# Patient Record
Sex: Male | Born: 1960 | Race: White | Hispanic: No | Marital: Single | Smoking: Never smoker
Health system: Southern US, Community
[De-identification: ages and names within clinical notes are randomized; demographics above are authoritative.]

## PROBLEM LIST (undated history)

## (undated) DIAGNOSIS — K219 Gastro-esophageal reflux disease without esophagitis: Secondary | ICD-10-CM

## (undated) DIAGNOSIS — H9190 Unspecified hearing loss, unspecified ear: Secondary | ICD-10-CM

## (undated) DIAGNOSIS — M1990 Unspecified osteoarthritis, unspecified site: Secondary | ICD-10-CM

## (undated) HISTORY — PX: HAND SURGERY: SHX662

## (undated) HISTORY — DX: Unspecified osteoarthritis, unspecified site: M19.90

---

## 1999-09-20 ENCOUNTER — Emergency Department (HOSPITAL_COMMUNITY): Admission: EM | Admit: 1999-09-20 | Discharge: 1999-09-20 | Payer: Self-pay | Admitting: Emergency Medicine

## 1999-09-21 ENCOUNTER — Emergency Department (HOSPITAL_COMMUNITY): Admission: EM | Admit: 1999-09-21 | Discharge: 1999-09-21 | Payer: Self-pay | Admitting: Emergency Medicine

## 2001-03-26 ENCOUNTER — Emergency Department (HOSPITAL_COMMUNITY): Admission: EM | Admit: 2001-03-26 | Discharge: 2001-03-26 | Payer: Self-pay | Admitting: *Deleted

## 2001-03-26 ENCOUNTER — Encounter: Payer: Self-pay | Admitting: Emergency Medicine

## 2001-03-27 ENCOUNTER — Ambulatory Visit (HOSPITAL_COMMUNITY): Admission: RE | Admit: 2001-03-27 | Discharge: 2001-03-27 | Payer: Self-pay | Admitting: Emergency Medicine

## 2001-03-27 ENCOUNTER — Encounter: Payer: Self-pay | Admitting: Emergency Medicine

## 2001-03-29 ENCOUNTER — Emergency Department (HOSPITAL_COMMUNITY): Admission: EM | Admit: 2001-03-29 | Discharge: 2001-03-29 | Payer: Self-pay | Admitting: Emergency Medicine

## 2014-04-14 ENCOUNTER — Encounter (INDEPENDENT_AMBULATORY_CARE_PROVIDER_SITE_OTHER): Payer: Self-pay | Admitting: Surgery

## 2014-04-29 ENCOUNTER — Encounter (INDEPENDENT_AMBULATORY_CARE_PROVIDER_SITE_OTHER): Payer: Self-pay | Admitting: Surgery

## 2014-04-29 ENCOUNTER — Ambulatory Visit (INDEPENDENT_AMBULATORY_CARE_PROVIDER_SITE_OTHER): Payer: BC Managed Care – PPO | Admitting: Surgery

## 2014-04-29 VITALS — BP 124/78 | HR 63 | Temp 98.0°F | Ht 71.0 in | Wt 229.0 lb

## 2014-04-29 DIAGNOSIS — K429 Umbilical hernia without obstruction or gangrene: Secondary | ICD-10-CM | POA: Insufficient documentation

## 2014-04-29 NOTE — Progress Notes (Signed)
Patient ID: Robert Wyatt, male   DOB: 01/21/1961, 53 y.o.   MRN: [id number]  Chief Complaint  Patient presents with  . Umbilical Hernia    HPI Robert DecemberWilliam T Sison is a 53 y.o. male.   HPI This is a pleasant gentleman referred by Dr. Phillips OdorGolding for evaluation of an umbilical hernia. The patient noticed it several weeks ago after golfing. He has mild discomfort right at the umbilicus. He has no nausea or vomiting or obstructive symptoms. He is otherwise healthy. Past Medical History  Diagnosis Date  . Arthritis     Past Surgical History  Procedure Laterality Date  . Hand surgery      History reviewed. No pertinent family history.  Social History History  Substance Use Topics  . Smoking status: Never Smoker   . Smokeless tobacco: Not on file  . Alcohol Use: Yes    No Known Allergies  Current Outpatient Prescriptions  Medication Sig Dispense Refill  . ibuprofen (ADVIL,MOTRIN) 100 MG chewable tablet Chew by mouth every 8 (eight) hours as needed.      . meloxicam (MOBIC) 15 MG tablet Take 15 mg by mouth daily.       No current facility-administered medications for this visit.    Review of Systems Review of Systems  Constitutional: Negative for fever, chills and unexpected weight change.  HENT: Negative for congestion, hearing loss, sore throat, trouble swallowing and voice change.   Eyes: Negative for visual disturbance.  Respiratory: Negative for cough and wheezing.   Cardiovascular: Negative for chest pain, palpitations and leg swelling.  Gastrointestinal: Positive for abdominal pain. Negative for nausea, vomiting, diarrhea, constipation, blood in stool, abdominal distention, anal bleeding and rectal pain.  Genitourinary: Negative for hematuria and difficulty urinating.  Musculoskeletal: Negative for arthralgias.  Skin: Negative for rash and wound.  Neurological: Negative for seizures, syncope, weakness and headaches.  Hematological: Negative for adenopathy. Does not  bruise/bleed easily.  Psychiatric/Behavioral: Negative for confusion.    Blood pressure 124/78, pulse 63, temperature 98 F (36.7 C), height 5\' 11"  (1.803 m), weight 229 lb (103.874 kg).  Physical Exam Physical Exam  Constitutional: He is oriented to person, place, and time. He appears well-developed and well-nourished. No distress.  HENT:  Head: Normocephalic and atraumatic.  Right Ear: External ear normal.  Left Ear: External ear normal.  Nose: Nose normal.  Mouth/Throat: Oropharynx is clear and moist.  Eyes: Conjunctivae are normal. Right eye exhibits no discharge. Left eye exhibits no discharge. No scleral icterus.  Neck: Normal range of motion. Neck supple. No tracheal deviation present.  Cardiovascular: Normal rate, regular rhythm, normal heart sounds and intact distal pulses.   No murmur heard. Pulmonary/Chest: Effort normal and breath sounds normal. No respiratory distress. He has no wheezes. He has no rales.  Abdominal: Soft. Bowel sounds are normal. He exhibits no distension. There is no tenderness. There is no rebound.  Small, slightly tender umbilical hernia located just above the umbilicus  Musculoskeletal: Normal range of motion. He exhibits no edema and no tenderness.  Lymphadenopathy:    He has no cervical adenopathy.  Neurological: He is alert and oriented to person, place, and time.  Skin: Skin is warm and dry. No rash noted. He is not diaphoretic. No erythema.  Psychiatric: His behavior is normal. Judgment normal.    Data Reviewed   Assessment    Umbilical hernia     Plan    I discussed the diagnoses with the patient in detail. Repair with mesh was recommended.  I discussed the procedure with him in detail. I discussed the risks of recurrence. He understands and wished to proceed with surgery.        Jonice Cerra A 04/29/2014, 2:37 PM

## 2014-06-03 ENCOUNTER — Encounter (HOSPITAL_BASED_OUTPATIENT_CLINIC_OR_DEPARTMENT_OTHER): Payer: Self-pay | Admitting: *Deleted

## 2014-06-03 NOTE — Progress Notes (Signed)
No labs needed

## 2014-06-08 NOTE — H&P (Signed)
Chief Complaint   Patient presents with   .  Umbilical Hernia   HPI  Robert Wyatt is a 53 y.o. male.  HPI  This is a pleasant gentleman referred by Dr. Phillips Odor for evaluation of an umbilical hernia. The patient noticed it several weeks ago after golfing. He has mild discomfort right at the umbilicus. He has no nausea or vomiting or obstructive symptoms. He is otherwise healthy.  Past Medical History   Diagnosis  Date   .  Arthritis     Past Surgical History   Procedure  Laterality  Date   .  Hand surgery     History reviewed. No pertinent family history.  Social History  History   Substance Use Topics   .  Smoking status:  Never Smoker   .  Smokeless tobacco:  Not on file   .  Alcohol Use:  Yes   No Known Allergies  Current Outpatient Prescriptions   Medication  Sig  Dispense  Refill   .  ibuprofen (ADVIL,MOTRIN) 100 MG chewable tablet  Chew by mouth every 8 (eight) hours as needed.     .  meloxicam (MOBIC) 15 MG tablet  Take 15 mg by mouth daily.      No current facility-administered medications for this visit.   Review of Systems  Review of Systems  Constitutional: Negative for fever, chills and unexpected weight change.  HENT: Negative for congestion, hearing loss, sore throat, trouble swallowing and voice change.  Eyes: Negative for visual disturbance.  Respiratory: Negative for cough and wheezing.  Cardiovascular: Negative for chest pain, palpitations and leg swelling.  Gastrointestinal: Positive for abdominal pain. Negative for nausea, vomiting, diarrhea, constipation, blood in stool, abdominal distention, anal bleeding and rectal pain.  Genitourinary: Negative for hematuria and difficulty urinating.  Musculoskeletal: Negative for arthralgias.  Skin: Negative for rash and wound.  Neurological: Negative for seizures, syncope, weakness and headaches.  Hematological: Negative for adenopathy. Does not bruise/bleed easily.  Psychiatric/Behavioral: Negative for  confusion.  Blood pressure 124/78, pulse 63, temperature 98 F (36.7 C), height 5\' 11"  (1.803 m), weight 229 lb (103.874 kg).  Physical Exam  Physical Exam  Constitutional: He is oriented to person, place, and time. He appears well-developed and well-nourished. No distress.  HENT:  Head: Normocephalic and atraumatic.  Right Ear: External ear normal.  Left Ear: External ear normal.  Nose: Nose normal.  Mouth/Throat: Oropharynx is clear and moist.  Eyes: Conjunctivae are normal. Right eye exhibits no discharge. Left eye exhibits no discharge. No scleral icterus.  Neck: Normal range of motion. Neck supple. No tracheal deviation present.  Cardiovascular: Normal rate, regular rhythm, normal heart sounds and intact distal pulses.  No murmur heard.  Pulmonary/Chest: Effort normal and breath sounds normal. No respiratory distress. He has no wheezes. He has no rales.  Abdominal: Soft. Bowel sounds are normal. He exhibits no distension. There is no tenderness. There is no rebound.  Small, slightly tender umbilical hernia located just above the umbilicus  Musculoskeletal: Normal range of motion. He exhibits no edema and no tenderness.  Lymphadenopathy:  He has no cervical adenopathy.  Neurological: He is alert and oriented to person, place, and time.  Skin: Skin is warm and dry. No rash noted. He is not diaphoretic. No erythema.  Psychiatric: His behavior is normal. Judgment normal.  Data Reviewed  Assessment  Umbilical hernia  Plan  I discussed the diagnoses with the patient in detail. Repair with mesh was recommended. I discussed the procedure  with him in detail. I discussed the risks of recurrence. He understands and wished to proceed with surgery.

## 2014-06-09 ENCOUNTER — Ambulatory Visit (HOSPITAL_BASED_OUTPATIENT_CLINIC_OR_DEPARTMENT_OTHER)
Admission: RE | Admit: 2014-06-09 | Discharge: 2014-06-09 | Disposition: A | Discharge status: Home / Self Care | Payer: BC Managed Care – PPO | Source: Ambulatory Visit | Attending: Surgery | Admitting: Surgery

## 2014-06-09 ENCOUNTER — Encounter (HOSPITAL_BASED_OUTPATIENT_CLINIC_OR_DEPARTMENT_OTHER): Payer: BC Managed Care – PPO | Admitting: Certified Registered"

## 2014-06-09 ENCOUNTER — Encounter (HOSPITAL_BASED_OUTPATIENT_CLINIC_OR_DEPARTMENT_OTHER)
Admission: RE | Disposition: A | Discharge status: Home / Self Care | Payer: Self-pay | Source: Ambulatory Visit | Attending: Surgery

## 2014-06-09 ENCOUNTER — Encounter (HOSPITAL_BASED_OUTPATIENT_CLINIC_OR_DEPARTMENT_OTHER): Payer: Self-pay | Admitting: *Deleted

## 2014-06-09 ENCOUNTER — Ambulatory Visit (HOSPITAL_BASED_OUTPATIENT_CLINIC_OR_DEPARTMENT_OTHER): Payer: BC Managed Care – PPO | Admitting: Certified Registered"

## 2014-06-09 DIAGNOSIS — Z79899 Other long term (current) drug therapy: Secondary | ICD-10-CM | POA: Diagnosis not present

## 2014-06-09 DIAGNOSIS — K429 Umbilical hernia without obstruction or gangrene: Secondary | ICD-10-CM

## 2014-06-09 DIAGNOSIS — M129 Arthropathy, unspecified: Secondary | ICD-10-CM | POA: Insufficient documentation

## 2014-06-09 HISTORY — PX: UMBILICAL HERNIA REPAIR: SHX196

## 2014-06-09 HISTORY — PX: INSERTION OF MESH: SHX5868

## 2014-06-09 HISTORY — DX: Gastro-esophageal reflux disease without esophagitis: K21.9

## 2014-06-09 HISTORY — DX: Unspecified hearing loss, unspecified ear: H91.90

## 2014-06-09 SURGERY — REPAIR, HERNIA, UMBILICAL, ADULT
Anesthesia: General | Site: Abdomen

## 2014-06-09 MED ORDER — MIDAZOLAM HCL 2 MG/2ML IJ SOLN
INTRAMUSCULAR | Status: AC
  Filled 2014-06-09: qty 2

## 2014-06-09 MED ORDER — FENTANYL CITRATE 0.05 MG/ML IJ SOLN
INTRAMUSCULAR | Status: AC
  Filled 2014-06-09: qty 6

## 2014-06-09 MED ORDER — FENTANYL CITRATE 0.05 MG/ML IJ SOLN: 50.0000 ug | INTRAMUSCULAR | Status: DC | PRN

## 2014-06-09 MED ORDER — PROPOFOL 10 MG/ML IV BOLUS
INTRAVENOUS | Status: DC | PRN
  Administered 2014-06-09: 200 mg via INTRAVENOUS

## 2014-06-09 MED ORDER — KETOROLAC TROMETHAMINE 30 MG/ML IJ SOLN
INTRAMUSCULAR | Status: DC | PRN
  Administered 2014-06-09: 30 mg via INTRAVENOUS

## 2014-06-09 MED ORDER — ACETAMINOPHEN 650 MG RE SUPP: 650.0000 mg | RECTAL | Status: DC | PRN

## 2014-06-09 MED ORDER — OXYCODONE-ACETAMINOPHEN 5-325 MG PO TABS: 1.0000 | ORAL_TABLET | ORAL | Status: DC | PRN

## 2014-06-09 MED ORDER — BUPIVACAINE HCL 0.5 % IJ SOLN
INTRAMUSCULAR | Status: DC | PRN
  Administered 2014-06-09: 20 mL

## 2014-06-09 MED ORDER — BUPIVACAINE HCL (PF) 0.5 % IJ SOLN
INTRAMUSCULAR | Status: AC
  Filled 2014-06-09: qty 30

## 2014-06-09 MED ORDER — ACETAMINOPHEN 325 MG PO TABS: 650.0000 mg | ORAL_TABLET | ORAL | Status: DC | PRN

## 2014-06-09 MED ORDER — SODIUM CHLORIDE 0.9 % IJ SOLN: 3.0000 mL | INTRAMUSCULAR | Status: DC | PRN

## 2014-06-09 MED ORDER — SODIUM CHLORIDE 0.9 % IV SOLN: 250.0000 mL | INTRAVENOUS | Status: DC | PRN

## 2014-06-09 MED ORDER — LACTATED RINGERS IV SOLN
INTRAVENOUS | Status: DC
  Administered 2014-06-09 (×2): via INTRAVENOUS

## 2014-06-09 MED ORDER — FENTANYL CITRATE 0.05 MG/ML IJ SOLN
INTRAMUSCULAR | Status: DC | PRN
  Administered 2014-06-09: 100 ug via INTRAVENOUS

## 2014-06-09 MED ORDER — MIDAZOLAM HCL 5 MG/5ML IJ SOLN
INTRAMUSCULAR | Status: DC | PRN
Start: 2014-06-09 — End: 2014-06-09
  Administered 2014-06-09: 2 mg via INTRAVENOUS

## 2014-06-09 MED ORDER — CEFAZOLIN SODIUM-DEXTROSE 2-3 GM-% IV SOLR
INTRAVENOUS | Status: AC
  Filled 2014-06-09: qty 50

## 2014-06-09 MED ORDER — LIDOCAINE HCL (CARDIAC) 20 MG/ML IV SOLN
INTRAVENOUS | Status: DC | PRN
  Administered 2014-06-09: 60 mg via INTRAVENOUS

## 2014-06-09 MED ORDER — KETOROLAC TROMETHAMINE 30 MG/ML IJ SOLN: 15.0000 mg | Freq: Once | INTRAMUSCULAR | Status: DC | PRN

## 2014-06-09 MED ORDER — MORPHINE SULFATE 2 MG/ML IJ SOLN: 1.0000 mg | INTRAMUSCULAR | Status: DC | PRN

## 2014-06-09 MED ORDER — MIDAZOLAM HCL 2 MG/2ML IJ SOLN: 1.0000 mg | INTRAMUSCULAR | Status: DC | PRN

## 2014-06-09 MED ORDER — OXYCODONE HCL 5 MG PO TABS
5.0000 mg | ORAL_TABLET | ORAL | Status: DC | PRN
  Administered 2014-06-09: 5 mg via ORAL

## 2014-06-09 MED ORDER — CEFAZOLIN SODIUM-DEXTROSE 2-3 GM-% IV SOLR
2.0000 g | INTRAVENOUS | Status: AC
  Administered 2014-06-09: 2 g via INTRAVENOUS

## 2014-06-09 MED ORDER — ONDANSETRON HCL 4 MG/2ML IJ SOLN: 4.0000 mg | Freq: Once | INTRAMUSCULAR | Status: DC | PRN

## 2014-06-09 MED ORDER — SODIUM CHLORIDE 0.9 % IJ SOLN: 3.0000 mL | Freq: Two times a day (BID) | INTRAMUSCULAR | Status: DC

## 2014-06-09 MED ORDER — OXYCODONE HCL 5 MG PO TABS
ORAL_TABLET | ORAL | Status: AC
  Filled 2014-06-09: qty 1

## 2014-06-09 MED ORDER — DEXAMETHASONE SODIUM PHOSPHATE 4 MG/ML IJ SOLN
INTRAMUSCULAR | Status: DC | PRN
  Administered 2014-06-09: 10 mg via INTRAVENOUS

## 2014-06-09 MED ORDER — FENTANYL CITRATE 0.05 MG/ML IJ SOLN
INTRAMUSCULAR | Status: AC
  Filled 2014-06-09: qty 2

## 2014-06-09 MED ORDER — FENTANYL CITRATE 0.05 MG/ML IJ SOLN
25.0000 ug | INTRAMUSCULAR | Status: DC | PRN
  Administered 2014-06-09: 50 ug via INTRAVENOUS

## 2014-06-09 MED ORDER — ONDANSETRON HCL 4 MG/2ML IJ SOLN
INTRAMUSCULAR | Status: DC | PRN
  Administered 2014-06-09: 4 mg via INTRAVENOUS

## 2014-06-09 SURGICAL SUPPLY — 52 items
BENZOIN TINCTURE PRP APPL 2/3 (GAUZE/BANDAGES/DRESSINGS) IMPLANT
BLADE CLIPPER SURG (BLADE) ×3 IMPLANT
BLADE HEX COATED 2.75 (ELECTRODE) ×3 IMPLANT
BLADE SURG 15 STRL LF DISP TIS (BLADE) ×1 IMPLANT
BLADE SURG 15 STRL SS (BLADE) ×2
CANISTER SUCT 1200ML W/VALVE (MISCELLANEOUS) IMPLANT
CHLORAPREP W/TINT 26ML (MISCELLANEOUS) ×3 IMPLANT
CLOSURE WOUND 1/2 X4 (GAUZE/BANDAGES/DRESSINGS)
COVER MAYO STAND STRL (DRAPES) ×3 IMPLANT
COVER TABLE BACK 60X90 (DRAPES) ×3 IMPLANT
DECANTER SPIKE VIAL GLASS SM (MISCELLANEOUS) IMPLANT
DERMABOND ADVANCED (GAUZE/BANDAGES/DRESSINGS) ×2
DERMABOND ADVANCED .7 DNX12 (GAUZE/BANDAGES/DRESSINGS) ×1 IMPLANT
DRAPE PED LAPAROTOMY (DRAPES) ×3 IMPLANT
DRAPE UTILITY XL STRL (DRAPES) ×3 IMPLANT
DRSG TEGADERM 2-3/8X2-3/4 SM (GAUZE/BANDAGES/DRESSINGS) IMPLANT
DRSG TEGADERM 4X4.75 (GAUZE/BANDAGES/DRESSINGS) IMPLANT
ELECT REM PT RETURN 9FT ADLT (ELECTROSURGICAL) ×3
ELECTRODE REM PT RTRN 9FT ADLT (ELECTROSURGICAL) ×1 IMPLANT
GLOVE BIO SURGEON STRL SZ7.5 (GLOVE) ×3 IMPLANT
GLOVE BIOGEL PI IND STRL 7.0 (GLOVE) ×1 IMPLANT
GLOVE BIOGEL PI IND STRL 8 (GLOVE) ×1 IMPLANT
GLOVE BIOGEL PI INDICATOR 7.0 (GLOVE) ×2
GLOVE BIOGEL PI INDICATOR 8 (GLOVE) ×2
GLOVE ECLIPSE 6.5 STRL STRAW (GLOVE) ×3 IMPLANT
GLOVE SURG SIGNA 7.5 PF LTX (GLOVE) ×3 IMPLANT
GOWN STRL REUS W/ TWL LRG LVL3 (GOWN DISPOSABLE) ×1 IMPLANT
GOWN STRL REUS W/ TWL XL LVL3 (GOWN DISPOSABLE) ×2 IMPLANT
GOWN STRL REUS W/TWL LRG LVL3 (GOWN DISPOSABLE) ×2
GOWN STRL REUS W/TWL XL LVL3 (GOWN DISPOSABLE) ×4
MESH VENTRALEX ST 1-7/10 CRC S (Mesh General) ×3 IMPLANT
NEEDLE HYPO 25X1 1.5 SAFETY (NEEDLE) ×3 IMPLANT
NS IRRIG 1000ML POUR BTL (IV SOLUTION) IMPLANT
PACK BASIN DAY SURGERY FS (CUSTOM PROCEDURE TRAY) ×3 IMPLANT
PENCIL BUTTON HOLSTER BLD 10FT (ELECTRODE) ×3 IMPLANT
SLEEVE SCD COMPRESS KNEE MED (MISCELLANEOUS) ×3 IMPLANT
SPONGE GAUZE 4X4 12PLY STER LF (GAUZE/BANDAGES/DRESSINGS) IMPLANT
SPONGE LAP 4X18 X RAY DECT (DISPOSABLE) ×3 IMPLANT
STRIP CLOSURE SKIN 1/2X4 (GAUZE/BANDAGES/DRESSINGS) IMPLANT
SUT MNCRL AB 4-0 PS2 18 (SUTURE) ×3 IMPLANT
SUT NOVA 0 T19/GS 22DT (SUTURE) ×3 IMPLANT
SUT NOVA NAB DX-16 0-1 5-0 T12 (SUTURE) ×3 IMPLANT
SUT VIC AB 2-0 SH 27 (SUTURE)
SUT VIC AB 2-0 SH 27XBRD (SUTURE) IMPLANT
SUT VIC AB 3-0 SH 27 (SUTURE) ×2
SUT VIC AB 3-0 SH 27X BRD (SUTURE) ×1 IMPLANT
SYR CONTROL 10ML LL (SYRINGE) ×3 IMPLANT
TOWEL OR 17X24 6PK STRL BLUE (TOWEL DISPOSABLE) ×3 IMPLANT
TOWEL OR NON WOVEN STRL DISP B (DISPOSABLE) ×3 IMPLANT
TUBE CONNECTING 20'X1/4 (TUBING)
TUBE CONNECTING 20X1/4 (TUBING) IMPLANT
YANKAUER SUCT BULB TIP NO VENT (SUCTIONS) IMPLANT

## 2014-06-09 NOTE — Transfer of Care (Signed)
Immediate Anesthesia Transfer of Care Note  Patient: Robert Wyatt  Procedure(s) Performed: Procedure(s): UMBILICAL HERNIA REPAIR WITH MESH (N/A) INSERTION OF MESH (N/A)  Patient Location: PACU  Anesthesia Type:General  Level of Consciousness: awake and patient cooperative  Airway & Oxygen Therapy: Patient Spontanous Breathing and Patient connected to face mask oxygen  Post-op Assessment: Report given to PACU RN and Post -op Vital signs reviewed and stable  Post vital signs: Reviewed and stable  Complications: No apparent anesthesia complications

## 2014-06-09 NOTE — Discharge Instructions (Signed)
[facility] _______[facility] ° °UMBILICAL OR INGUINAL HERNIA REPAIR: POST OP INSTRUCTIONS ° °Always review your discharge instruction sheet given to you by the facility where your surgery was performed. °IF YOU HAVE DISABILITY OR FAMILY LEAVE FORMS, YOU MUST BRING THEM TO THE OFFICE FOR PROCESSING.   °DO NOT GIVE THEM TO YOUR DOCTOR. ° °1. A  prescription for pain medication may be given to you upon discharge.  Take your pain medication as prescribed, if needed.  If narcotic pain medicine is not needed, then you may take acetaminophen (Tylenol) or ibuprofen (Advil) as needed. °2. Take your usually prescribed medications unless otherwise directed. °3. If you need a refill on your pain medication, please contact your pharmacy.  They will contact our office to request authorization. Prescriptions will not be filled after 5 pm or on week-ends. °4. You should follow a light diet the first 24 hours after arrival home, such as soup and crackers, etc.  Be sure to include lots of fluids daily.  Resume your normal diet the day after surgery. °5. Most patients will experience some swelling and bruising around the umbilicus or in the groin and scrotum.  Ice packs and reclining will help.  Swelling and bruising can take several days to resolve.  °6. It is common to experience some constipation if taking pain medication after surgery.  Increasing fluid intake and taking a stool softener (such as Colace) will usually help or prevent this problem from occurring.  A mild laxative (Milk of Magnesia or Miralax) should be taken according to package directions if there are no bowel movements after 48 hours. °7. Unless discharge instructions indicate otherwise, you may remove your bandages 24-48 hours after surgery, and you may shower at that time.  You may have steri-strips (small skin tapes) in place directly over the incision.  These strips should be left on the skin for 7-10 days.  If your surgeon used skin glue on the  incision, you may shower in 24 hours.  The glue will flake off over the next 2-3 weeks.  Any sutures or staples will be removed at the office during your follow-up visit. °8. ACTIVITIES:  You may resume regular (light) daily activities beginning the next day--such as daily self-care, walking, climbing stairs--gradually increasing activities as tolerated.  You may have sexual intercourse when it is comfortable.  Refrain from any heavy lifting or straining until approved by your doctor. °a. You may drive when you are no longer taking prescription pain medication, you can comfortably wear a seatbelt, and you can safely maneuver your car and apply brakes. °b. RETURN TO WORK:  __________________________________________________________ °9. You should see your doctor in the office for a follow-up appointment approximately 2-3 weeks after your surgery.  Make sure that you call for this appointment within a day or two after you arrive home to insure a convenient appointment time. °10. OTHER INSTRUCTIONS: NO LIFTING MORE THAN 15 POUNDS FOR 4 WEEKS °11. ICE PACK AND IBUPROFEN ALSO FOR PAIN __________________________________________________________________________________________________________________________________________________________________________________________  °WHEN TO CALL YOUR DOCTOR: °1. Fever over 101.0 °2. Inability to urinate °3. Nausea and/or vomiting °4. Extreme swelling or bruising °5. Continued bleeding from incision. °6. Increased pain, redness, or drainage from the incision ° °The clinic staff is available to answer your questions during regular business hours.  Please don’t hesitate to call and ask to speak to one of the nurses for clinical concerns.  If you have a medical emergency, go to the nearest emergency room or call 911.    A surgeon from [facility] is always on call at the hospital ° ° °[address], [address], [place], [place]  [address] ? ° [address] [address], [place], [place]    [address] °[phone] ? [phone] ? FAX [phone] °Web site: [email or web address] ° ° °Post Anesthesia Home Care Instructions ° °Activity: °Get plenty of rest for the remainder of the day. A responsible adult should stay with you for 24 hours following the procedure.  °For the next 24 hours, DO NOT: °-Drive a car °-Operate machinery °-Drink alcoholic beverages °-Take any medication unless instructed by your physician °-Make any legal decisions or sign important papers. ° °Meals: °Start with liquid foods such as gelatin or soup. Progress to regular foods as tolerated. Avoid greasy, spicy, heavy foods. If nausea and/or vomiting occur, drink only clear liquids until the nausea and/or vomiting subsides. Call your physician if vomiting continues. ° °Special Instructions/Symptoms: °Your throat may feel dry or sore from the anesthesia or the breathing tube placed in your throat during surgery. If this causes discomfort, gargle with warm salt water. The discomfort should disappear within 24 hours. ° ° ° °

## 2014-06-09 NOTE — Interval H&P Note (Signed)
History and Physical Interval Note:no change in H and P  06/09/2014 7:08 AM  Robert DecemberWilliam T Wyatt  has presented today for surgery, with the diagnosis of umbilical hernia  The various methods of treatment have been discussed with the patient and family. After consideration of risks, benefits and other options for treatment, the patient has consented to  Procedure(s): UMBILICAL HERNIA REPAIR WITH MESH (N/A) INSERTION OF MESH (N/A) as a surgical intervention .  The patient's history has been reviewed, patient examined, no change in status, stable for surgery.  I have reviewed the patient's chart and labs.  Questions were answered to the patient's satisfaction.     [name]

## 2014-06-09 NOTE — Op Note (Signed)
UMBILICAL HERNIA REPAIR WITH MESH, INSERTION OF MESH  Procedure Note  Robert DecemberWilliam T Wyatt 06/09/2014   Pre-op Diagnosis: umbilical hernia     Post-op Diagnosis: same  Procedure(s): UMBILICAL HERNIA REPAIR WITH MESH INSERTION OF MESH  Surgeon(s): Shelly Rubensteinouglas A Davena Julian, MD  Anesthesia: General  Staff:  Circulator: Michelle NasutiPamela J Chambers, RN Relief Scrub: Joylene GrapesJorge A Garzon, RN Scrub Person: Idell Pickleseborah R Flannagan, CST  Estimated Blood Loss: Minimal               Indications: This is a 53 year old woman with a symptomatic umbilical hernia. The decision has been made to proceed to the operating room for repair with mesh.  Procedure: The patient was brought to the operating room and identified as correct patient. He was placed on the operating room table and general anesthesia was induced. His abdomen was prepped and draped in usual sterile fashion. Anesthetize the skin at the upper portion of the umbilicus with Marcaine. I then made a longitudinal incision above the umbilicus with a scalpel. I did this down to the hernia sac which I dissected free circumferentially with a hemostat. I then excised the sac which was found to contain only fat. The actual fascial defect was only approximately 2 cm in size. I brought a 4.3 round ventral umbilical patch onto the field. I placed it through the fascial opening and then pulled up against the peritoneal surface with the ties. I then sewed the mesh in place circumferentially with interrupted zero Novafil sutures. I think at this day times and closed the fascia over the top of the mesh figure-of-eight 1 Novafil suture.  I Anesthetize the fascia further with Marcaine. I then closed the subcutaneous tissue with interrupted 3-0 Vicryl sutures and closed skin with a running 4-0 Monocryl. Dermabond was applied. The patient tolerated the procedure well. All the counts were correct at the end of the procedure. The patient was then extubated in the operating room and taken in  stable condition to the recovery room.          [name] A   Date: 06/09/2014  Time: 9:10 AM

## 2014-06-09 NOTE — Anesthesia Preprocedure Evaluation (Signed)
Anesthesia Evaluation  Patient identified by MRN, date of birth, ID band Patient awake    Reviewed: Allergy & Precautions, H&P , NPO status , Patient's Chart, lab work & pertinent test results  Airway Mallampati: II TM Distance: >3 FB Neck ROM: Full    Dental  (+) Teeth Intact, Dental Advisory Given   Pulmonary  breath sounds clear to auscultation        Cardiovascular Rhythm:Regular Rate:Normal     Neuro/Psych    GI/Hepatic   Endo/Other    Renal/GU      Musculoskeletal   Abdominal   Peds  Hematology   Anesthesia Other Findings   Reproductive/Obstetrics                           Anesthesia Physical Anesthesia Plan  ASA: II  Anesthesia Plan: General   Post-op Pain Management:    Induction: Intravenous  Airway Management Planned: LMA  Additional Equipment:   Intra-op Plan:   Post-operative Plan: Extubation in OR  Informed Consent: I have reviewed the patients History and Physical, chart, labs and discussed the procedure including the risks, benefits and alternatives for the proposed anesthesia with the patient or authorized representative who has indicated his/her understanding and acceptance.   Dental advisory given  Plan Discussed with: CRNA and Anesthesiologist  Anesthesia Plan Comments: (GERD  Plan GA with LMA  Kipp Broodavid Lamar Meter)        Anesthesia Quick Evaluation

## 2014-06-09 NOTE — Anesthesia Postprocedure Evaluation (Signed)
  Anesthesia Post-op Note  Patient: Robert DecemberWilliam T Wyatt  Procedure(s) Performed: Procedure(s): UMBILICAL HERNIA REPAIR WITH MESH (N/A) INSERTION OF MESH (N/A)  Patient Location: PACU  Anesthesia Type:General  Level of Consciousness: awake, alert  and oriented  Airway and Oxygen Therapy: Patient Spontanous Breathing and Patient connected to nasal cannula oxygen  Post-op Pain: mild  Post-op Assessment: Post-op Vital signs reviewed, Patient's Cardiovascular Status Stable, Respiratory Function Stable, Patent Airway, No signs of Nausea or vomiting and Pain level controlled  Post-op Vital Signs: stable  Last Vitals:  Filed Vitals:   06/09/14 1050  BP: 150/101  Pulse: 72  Temp: 36.4 C  Resp: 16    Complications: No apparent anesthesia complications

## 2014-06-09 NOTE — Anesthesia Procedure Notes (Signed)
Procedure Name: LMA Insertion Date/Time: 06/09/2014 8:34 AM Performed by: Neely Cecena Pre-anesthesia Checklist: Patient identified, Emergency Drugs available, Suction available and Patient being monitored Patient Re-evaluated:Patient Re-evaluated prior to inductionOxygen Delivery Method: Circle System Utilized Preoxygenation: Pre-oxygenation with 100% oxygen Intubation Type: IV induction Ventilation: Mask ventilation without difficulty LMA: LMA inserted LMA Size: 5.0 Number of attempts: 1 Airway Equipment and Method: bite block Placement Confirmation: positive ETCO2 Tube secured with: Tape Dental Injury: Teeth and Oropharynx as per pre-operative assessment

## 2014-06-10 ENCOUNTER — Encounter (HOSPITAL_BASED_OUTPATIENT_CLINIC_OR_DEPARTMENT_OTHER): Payer: Self-pay | Admitting: Surgery

## 2014-06-18 ENCOUNTER — Encounter (INDEPENDENT_AMBULATORY_CARE_PROVIDER_SITE_OTHER): Payer: Self-pay

## 2014-07-08 ENCOUNTER — Encounter (INDEPENDENT_AMBULATORY_CARE_PROVIDER_SITE_OTHER): Payer: BC Managed Care – PPO | Admitting: Surgery

## 2016-02-13 ENCOUNTER — Emergency Department (INDEPENDENT_AMBULATORY_CARE_PROVIDER_SITE_OTHER)
Admission: EM | Admit: 2016-02-13 | Discharge: 2016-02-13 | Disposition: A | Discharge status: Home / Self Care | Payer: BLUE CROSS/BLUE SHIELD | Source: Home / Self Care | Attending: Family Medicine | Admitting: Family Medicine

## 2016-02-13 ENCOUNTER — Encounter: Payer: Self-pay | Admitting: Emergency Medicine

## 2016-02-13 DIAGNOSIS — L03032 Cellulitis of left toe: Secondary | ICD-10-CM

## 2016-02-13 MED ORDER — MUPIROCIN 2 % EX OINT: TOPICAL_OINTMENT | CUTANEOUS | Status: DC

## 2016-02-13 MED ORDER — CEPHALEXIN 500 MG PO CAPS: 500.0000 mg | ORAL_CAPSULE | Freq: Two times a day (BID) | ORAL | Status: DC

## 2016-02-13 NOTE — ED Notes (Signed)
[facility]/O pain in the left middle toe 5 to 6 days, he advised that it started off with what he though was an ingrown toenail, pus type drainage and redness on Wednesday he has used Neosporin and a band aid without relief.

## 2016-02-13 NOTE — ED Provider Notes (Signed)
CSN: [id number]     Arrival date & time 02/13/16  1316 History   None    Chief Complaint  Patient presents with  . Toe Pain   (Consider location/radiation/quality/duration/timing/severity/associated sxs/prior Treatment) HPI  The pt is a 55yo male [facility]/o Left middle toe infection that has gradually worsened over the last 5-6 days.  He initially thought it was an ingrown nail but after trying to mess with it, it developed worsening pain and redness.  The other day he took a sterile straight blade and made a tiny incision, expressed some pus.  He has been using neosporin but no relief of symptoms. Denies fever, chills, n/v/d.   Past Medical History  Diagnosis Date  . Arthritis   . GERD (gastroesophageal reflux disease)   . Hearing decreased    Past Surgical History  Procedure Laterality Date  . Hand surgery      age 10-cut rt palm-nerve   . Umbilical hernia repair N/A 06/09/2014    Procedure: UMBILICAL HERNIA REPAIR WITH MESH;  Surgeon: Shelly Rubensteinouglas A Blackman, MD;  Location: [facility];  Service: General;  Laterality: N/A;  . Insertion of mesh N/A 06/09/2014    Procedure: INSERTION OF MESH;  Surgeon: Shelly Rubensteinouglas A Blackman, MD;  Location:  SURGERY CENTER;  Service: General;  Laterality: N/A;   History reviewed. No pertinent family history. Social History  Substance Use Topics  . Smoking status: Never Smoker   . Smokeless tobacco: None  . Alcohol Use: Yes     Comment: occ    Review of Systems  Constitutional: Negative for fever and chills.  Musculoskeletal: Positive for joint swelling and arthralgias. Negative for myalgias.       Left middle toe  Skin: Positive for color change and wound.    Allergies  Review of patient's allergies indicates no known allergies.  Home Medications   Prior to Admission medications   Medication Sig Start Date End Date Taking? Authorizing Provider  cephALEXin (KEFLEX) 500 MG capsule Take 1 capsule (500 mg  total) by mouth 2 (two) times daily. For 7 days 02/13/16   Junius FinnerErin O'Malley, PA-C  ibuprofen (ADVIL,MOTRIN) 100 MG chewable tablet Chew by mouth every 8 (eight) hours as needed.    Historical Provider, MD  meloxicam (MOBIC) 15 MG tablet Take 15 mg by mouth daily.    Historical Provider, MD  Multiple Vitamins-Minerals (MULTIVITAMIN WITH MINERALS) tablet Take 1 tablet by mouth daily.    Historical Provider, MD  mupirocin ointment (BACTROBAN) 2 % Apply to toe TID for 5 days 02/13/16   Junius FinnerErin O'Malley, PA-C  oxyCODONE-acetaminophen (ROXICET) 5-325 MG per tablet Take 1-2 tablets by mouth every 4 (four) hours as needed for severe pain. 06/09/14   Abigail Miyamotoouglas Blackman, MD  ranitidine (ZANTAC) 150 MG tablet Take 150 mg by mouth as needed for heartburn.    Historical Provider, MD   Meds Ordered and Administered this Visit  Medications - No data to display  BP 120/74 mmHg  Pulse 76  Temp(Src) 98.3 F (36.8 C) (Oral)  Resp 16  Ht 5\' 11"  (1.803 m)  Wt 234 lb 4 oz (106.255 kg)  BMI 32.69 kg/m2  SpO2 94% No data found.   Physical Exam  Constitutional: He is oriented to person, place, and time. He appears well-developed and well-nourished.  HENT:  Head: Normocephalic and atraumatic.  Eyes: EOM are normal.  Neck: Normal range of motion.  Cardiovascular: Normal rate.   Pulmonary/Chest: Effort normal.  Musculoskeletal: Normal  range of motion. He exhibits edema and tenderness.  Left middle toe: mild edema, full ROM. Tender (see skin exam)  Neurological: He is alert and oriented to person, place, and time.  Skin: Skin is warm and dry. There is erythema.  Left middle toe: mild erythema along medial aspect of nailbed with scan yellow crusting and scant amount of draining pus. Tender to touch.  Psychiatric: He has a normal mood and affect. His behavior is normal.  Nursing note and vitals reviewed.   ED Course  Procedures (including critical care time)  Labs Review Labs Reviewed - No data to  display  Imaging Review No results found.   MDM   1. Paronychia of third toe, left    Pt c/o worsening infected Left middle toe. Toe spontaneously draining scant amount of pus.  Will hold off on I&D   Rx: Mupirocin ointment and keflex.  Encouraged warm soaks a few times a day. F/u with PCP or return to [facility] if needed in 4-5 days for recheck of symptoms if not improving, sooner if worsening. Patient verbalized understanding and agreement with treatment plan.      Junius Finner, PA-C 02/13/16 1410

## 2016-02-13 NOTE — Discharge Instructions (Signed)
Paronychia °Paronychia is an infection of the skin that surrounds a nail. It usually affects the skin around a fingernail, but it may also occur near a toenail. It often causes pain and swelling around the nail. This condition may come on suddenly or develop over a longer period. In some cases, a collection of pus (abscess) can form near or under the nail. Usually, paronychia is not serious and it clears up with treatment. °CAUSES °This condition may be caused by bacteria or fungi. It is commonly caused by either Streptococcus or Staphylococcus bacteria. The bacteria or fungi often cause the infection by getting into the affected area through an opening in the skin, such as a cut or a hangnail. °RISK FACTORS °This condition is more likely to develop in: °· People who get their hands wet often, such as those who work as dishwashers, bartenders, or nurses. °· People who bite their fingernails or suck their thumbs. °· People who trim their nails too short. °· People who have hangnails or injured fingertips. °· People who get manicures. °· People who have diabetes. °SYMPTOMS °Symptoms of this condition include: °· Redness and swelling of the skin near the nail. °· Tenderness around the nail when you touch the area. °· Pus-filled bumps under the cuticle. The cuticle is the skin at the base or sides of the nail. °· Fluid or pus under the nail. °· Throbbing pain in the area. °DIAGNOSIS °This condition is usually diagnosed with a physical exam. In some cases, a sample of pus may be taken from an abscess to be tested in a lab. This can help to determine what type of bacteria or fungi is causing the condition. °TREATMENT °Treatment for this condition depends on the cause and severity of the condition. If the condition is mild, it may clear up on its own in a few days. Your health care provider may recommend soaking the affected area in warm water a few times a day. When treatment is needed, the options may  include: °· Antibiotic medicine, if the condition is caused by a bacterial infection. °· Antifungal medicine, if the condition is caused by a fungal infection. °· Incision and drainage, if an abscess is present. In this procedure, the health care provider will cut open the abscess so the pus can drain out. °HOME CARE INSTRUCTIONS °· Soak the affected area in warm water if directed to do so by your health care provider. You may be told to do this for 20 minutes, 2-3 times a day. Keep the area dry in between soakings. °· Take medicines only as directed by your health care provider. °· If you were prescribed an antibiotic medicine, finish all of it even if you start to feel better. °· Keep the affected area clean. °· Do not try to drain a fluid-filled bump yourself. °· If you will be washing dishes or performing other tasks that require your hands to get wet, wear rubber gloves. You should also wear gloves if your hands might come in contact with irritating substances, such as cleaners or chemicals. °· Follow your health care provider's instructions about: °¨ Wound care. °¨ Bandage (dressing) changes and removal. °SEEK MEDICAL CARE IF: °· Your symptoms get worse or do not improve with treatment. °· You have a fever or chills. °· You have redness spreading from the affected area. °· You have continued or increased fluid, blood, or pus coming from the affected area. °· Your finger or knuckle becomes swollen or is difficult to move. °  °  This information is not intended to replace advice given to you by your health care provider. Make sure you discuss any questions you have with your health care provider. °  °Document Released: 04/04/2001 Document Revised: 02/23/2015 Document Reviewed: 09/16/2014 °Elsevier Interactive Patient Education ©2016 [employer] ° °

## 2016-02-16 ENCOUNTER — Telehealth: Payer: Self-pay | Admitting: *Deleted

## 2016-06-20 DIAGNOSIS — R739 Hyperglycemia, unspecified: Secondary | ICD-10-CM | POA: Diagnosis not present

## 2016-06-20 DIAGNOSIS — Z0000 Encounter for general adult medical examination without abnormal findings: Secondary | ICD-10-CM | POA: Diagnosis not present

## 2016-06-22 DIAGNOSIS — R7303 Prediabetes: Secondary | ICD-10-CM | POA: Diagnosis not present

## 2016-06-22 DIAGNOSIS — R197 Diarrhea, unspecified: Secondary | ICD-10-CM | POA: Diagnosis not present

## 2016-06-22 DIAGNOSIS — I10 Essential (primary) hypertension: Secondary | ICD-10-CM | POA: Diagnosis not present

## 2016-06-22 DIAGNOSIS — M25552 Pain in left hip: Secondary | ICD-10-CM | POA: Diagnosis not present

## 2016-08-08 DIAGNOSIS — Z23 Encounter for immunization: Secondary | ICD-10-CM | POA: Diagnosis not present

## 2016-12-11 DIAGNOSIS — Z0000 Encounter for general adult medical examination without abnormal findings: Secondary | ICD-10-CM | POA: Diagnosis not present

## 2016-12-11 DIAGNOSIS — Z125 Encounter for screening for malignant neoplasm of prostate: Secondary | ICD-10-CM | POA: Diagnosis not present

## 2016-12-14 DIAGNOSIS — E7800 Pure hypercholesterolemia, unspecified: Secondary | ICD-10-CM | POA: Diagnosis not present

## 2016-12-14 DIAGNOSIS — Z8249 Family history of ischemic heart disease and other diseases of the circulatory system: Secondary | ICD-10-CM | POA: Diagnosis not present

## 2016-12-14 DIAGNOSIS — K219 Gastro-esophageal reflux disease without esophagitis: Secondary | ICD-10-CM | POA: Diagnosis not present

## 2016-12-14 DIAGNOSIS — Z0001 Encounter for general adult medical examination with abnormal findings: Secondary | ICD-10-CM | POA: Diagnosis not present

## 2016-12-27 ENCOUNTER — Encounter: Payer: Self-pay | Admitting: Student

## 2016-12-27 ENCOUNTER — Ambulatory Visit (INDEPENDENT_AMBULATORY_CARE_PROVIDER_SITE_OTHER): Payer: BLUE CROSS/BLUE SHIELD | Admitting: Student

## 2016-12-27 ENCOUNTER — Ambulatory Visit: Payer: Self-pay

## 2016-12-27 VITALS — BP 124/81 | Ht 71.0 in | Wt 221.0 lb

## 2016-12-27 DIAGNOSIS — M7072 Other bursitis of hip, left hip: Secondary | ICD-10-CM

## 2016-12-27 DIAGNOSIS — M5442 Lumbago with sciatica, left side: Secondary | ICD-10-CM

## 2016-12-27 DIAGNOSIS — M79605 Pain in left leg: Secondary | ICD-10-CM | POA: Diagnosis not present

## 2016-12-27 MED ORDER — PREDNISONE 10 MG PO TABS
ORAL_TABLET | ORAL | 0 refills | Status: DC
Start: 2016-12-27 — End: 2022-01-25

## 2016-12-27 NOTE — Progress Notes (Addendum)
Robert Wyatt - 56 y.o. male MRN [id number]  Date of birth: 09/01/1961  SUBJECTIVE:  Including CC & ROS.  CC:low back pain with sciatica  Complains of low back pain and pain in his gluteal region that radiates down his left hamstring.  This is been ongoing for the past 5-6 months. He is having pain when he sits down for long periods and when he sits down to use the toilet daily. It is a sharp pain but it resolves when he is not putting pressure on it. His low back pain has been ongoing also for the past 5-6 months. He has had low back pain on and off for the past few years. It is become worse in the past month or so. He has been taking meloxicam which has helped somewhat. However whenever he stops it, the pain comes back.  He denies any weakness of his lower extremities. He denies any numbness or tingling. 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  He feels he is able to do his job but is become cumbersome to do so. [facility] with [facility].   ROS: No unexpected weight loss, fever, chills, swelling, instability, muscle pain, numbness/tingling, redness, otherwise see HPI   PMHx - Updated and reviewed.  Contributory factors include: Hypertension, anxiety, prediabetes PSHx - Updated and reviewed.  Contributory factors include:  Negative FHx - Updated and reviewed.  Contributory factors include:  Negative Social Hx - Updated and reviewed. Contributory factors include: Works at [employer] Medications - reviewed   DATA REVIEWED: PCP office notes  PHYSICAL EXAM:  VS: BP:124/81  HR: bpm  TEMP: ( )  RESP:   HT:5\' 11"  (180.3 cm)   WT:221 lb (100.2 kg)  BMI:30.9 PHYSICAL EXAM: Gen: NAD, alert, cooperative with exam, well-appearing HEENT: clear conjunctiva,  CV:  no edema, capillary refill brisk, normal rate Resp: non-labored Skin: no rashes, normal turgor  Neuro: no gross deficits.  Psych:  alert and oriented  Back Exam:  Inspection:  Unremarkable  Palpable tenderness: None, but does have bilateral paraspinal hypertonicity. Range of Motion:  Flexion 45 deg; Extension 45 deg; Side Bending to 45 deg bilaterally; Rotation to 45 deg bilaterally  Leg strength: Quad: 5/5 Hamstring: 5/5 Hip flexor: 5/5 Hip abductors: 5/5  Strength at foot: Plantar-flexion: 5/5 Dorsi-flexion: 5/5 Eversion: 5/5 Inversion: 5/5  Sensory change: Gross sensation intact to all lumbar and sacral dermatomes.  Reflexes: 3+ at both patellar tendons, 2+ at achilles tendons, Babinski's downgoing.  Gait unremarkable. SLR laying: Negative  XSLR laying: Negative  FABER: negative.  Hip: ROM IR: 45 Deg, ER: 45 Deg, Flexion: 120 Deg, Extension: 100 Deg, Abduction: 45 Deg, Adduction: 45 Deg Strength IR: 5/5, ER: 5/5, Flexion: 5/5, Extension: 5/5, Abduction: 5/5, Adduction: 5/5 Pelvic alignment unremarkable to inspection and palpation. Standing hip rotation and gait without trendelenburg sign / unsteadiness. Greater trochanter without tenderness to palpation. Does have tenderness to palpation over the ischial tuberosity on the left No tenderness over piriformis. No pain with FABER or FADIR. No SI joint tenderness and normal minimal SI movement.  Ultrasound: Limited ultrasound of left posterior hip performed in long and short axis.  Hamstring attachment to the ischial tuberosity viewed with some swelling of the bursa. Hamstring tendon was intact proximally. No changes in the fibrillary pattern of the hamstring tendon. Sciatic nerve with some hypoechoic changes surrounding. Findings consistent with ischial tuberosity bursitis.    ASSESSMENT & PLAN:   Ischial bursitis  of left side Started prednisone Dosepak to help with pain and inflammation. Recommended using a padded doughnut seat cushion to help take pressure off the area. Also recommended exercises for his hamstring, specifically the Askling exercises, to help with the pain and rehabilitation. This can be  performed at [facility] with his trainers or therapist.

## 2016-12-27 NOTE — Patient Instructions (Signed)
Greater tuberosity bursitis is the diagnosis  Needs to do Askling Exercises.

## 2016-12-28 DIAGNOSIS — M7072 Other bursitis of hip, left hip: Secondary | ICD-10-CM | POA: Insufficient documentation

## 2016-12-28 NOTE — Assessment & Plan Note (Addendum)
Started prednisone Dosepak to help with pain and inflammation. Recommended using a padded doughnut seat cushion to help take pressure off the area. Also recommended exercises for his hamstring, specifically the Askling exercises, to help with the pain and rehabilitation. [facility] or therapist.

## 2017-01-30 ENCOUNTER — Ambulatory Visit
Admission: RE | Admit: 2017-01-30 | Discharge: 2017-01-30 | Disposition: A | Discharge status: Home / Self Care | Payer: BLUE CROSS/BLUE SHIELD | Source: Ambulatory Visit | Attending: Student | Admitting: Student

## 2017-01-30 DIAGNOSIS — M48061 Spinal stenosis, lumbar region without neurogenic claudication: Secondary | ICD-10-CM | POA: Diagnosis not present

## 2017-01-30 DIAGNOSIS — M5442 Lumbago with sciatica, left side: Secondary | ICD-10-CM

## 2017-01-30 DIAGNOSIS — M545 Low back pain: Secondary | ICD-10-CM | POA: Diagnosis not present

## 2017-01-30 DIAGNOSIS — M79605 Pain in left leg: Secondary | ICD-10-CM

## 2017-01-31 ENCOUNTER — Telehealth: Payer: Self-pay | Admitting: Student

## 2017-01-31 DIAGNOSIS — N400 Enlarged prostate without lower urinary tract symptoms: Secondary | ICD-10-CM | POA: Diagnosis not present

## 2017-01-31 NOTE — Telephone Encounter (Signed)
Called and left message that would like to go over x-rays, arthritis in low back and b/l hips.  May need MRI L-spine if having radicular symptoms.  LM to call back to discuss.  Signed,  Corliss Marcus, DO [facility] 1:16 PM

## 2017-02-08 ENCOUNTER — Other Ambulatory Visit: Payer: Self-pay | Admitting: Student

## 2017-02-08 ENCOUNTER — Ambulatory Visit
Admission: RE | Admit: 2017-02-08 | Discharge: 2017-02-08 | Disposition: A | Discharge status: Home / Self Care | Payer: BLUE CROSS/BLUE SHIELD | Source: Ambulatory Visit | Attending: Student | Admitting: Student

## 2017-02-08 ENCOUNTER — Encounter: Payer: Self-pay | Admitting: Student

## 2017-02-08 ENCOUNTER — Ambulatory Visit (INDEPENDENT_AMBULATORY_CARE_PROVIDER_SITE_OTHER): Payer: BLUE CROSS/BLUE SHIELD | Admitting: Student

## 2017-02-08 VITALS — BP 126/77 | Ht 71.0 in | Wt 221.0 lb

## 2017-02-08 DIAGNOSIS — M5442 Lumbago with sciatica, left side: Secondary | ICD-10-CM

## 2017-02-08 DIAGNOSIS — M542 Cervicalgia: Secondary | ICD-10-CM

## 2017-02-08 DIAGNOSIS — Z77018 Contact with and (suspected) exposure to other hazardous metals: Secondary | ICD-10-CM

## 2017-02-08 DIAGNOSIS — Z01818 Encounter for other preprocedural examination: Secondary | ICD-10-CM | POA: Diagnosis not present

## 2017-02-08 MED ORDER — PREDNISONE 10 MG PO TABS: ORAL_TABLET | ORAL | 0 refills | Status: DC

## 2017-02-08 MED ORDER — CYCLOBENZAPRINE HCL 10 MG PO TABS: 10.0000 mg | ORAL_TABLET | Freq: Every evening | ORAL | 1 refills | Status: DC | PRN

## 2017-02-08 NOTE — Assessment & Plan Note (Addendum)
He has not been responding to treatment for it she'll tuberosity bursitis. Other sources of this pain that he is having could them from his low back. He does have L4-L5 disc degeneration. We will get an MRI of his lumbar spine and he will follow-up after the MRI is completed to review the results. Did review potential plans which could include: Medications, physical therapy, epidural spinal injections versus facet injections, and surgery.  We'll place him on a longer term prednisone Dosepak.

## 2017-02-08 NOTE — Progress Notes (Signed)
Robert Wyatt - 56 y.o. male MRN [id number]  Date of birth: Sep 13, 1961  SUBJECTIVE:  Including CC & ROS.  CC: low back pain and neck pain  Presents in follow up for low back pain that radiates to glute and down posterior left leg.  Denies weakness, numbness, tingling. This has been going on for the past 6-7 months. It is interfering with his work. 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 However they are only there once a week and he has an increasingly busy schedule. He would like answers as to if he has been nerve problem stemming from his back. He reports that the treatments for she'll tuberosity bursitis or not working. It is really become worse. He has taken prednisone for a week and it did not help much. Did review x-ray results of his low back and pelvis as well.    He also complains of neck pain that has been ongoing for the past 4-5 months. It radiates into his scapular area. He has a lot of trapezius tightness as well. He denies any weakness or numbness or tingling.   ROS: No unexpected weight loss, fever, chills, swelling, instability, muscle pain, numbness/tingling, redness, otherwise see HPI   PMHx - Updated and reviewed.  Contributory factors include: Negative PSHx - Updated and reviewed.  Contributory factors include:  Negative FHx - Updated and reviewed.  Contributory factors include:  Negative Social Hx - Updated and reviewed. Contributory factors include: Negative Medications - reviewed   DATA REVIEWED: Two-view lumbar spine x-rays show degenerative disc disease at the L4-L5 vertebrae Two-view of his bilateral hips reveal moderate osteoarthritis in the joint   PHYSICAL EXAM:  VS: BP:126/77  HR: bpm  TEMP: ( )  RESP:   HT:5\' 11"  (180.3 cm)   WT:221 lb (100.2 kg)  BMI:30.9 PHYSICAL EXAM: Gen: NAD, alert, cooperative with exam, well-appearing HEENT: clear conjunctiva,  CV:  no edema, capillary refill brisk, normal rate Resp: non-labored Skin: no rashes,  normal turgor  Neuro: no gross deficits.  Psych:  alert and oriented Back Exam:  Inspection: Unremarkable  Palpable tenderness: None. Range of Motion:  Flexion 45 deg; Extension 45 deg; Side Bending to 45 deg bilaterally; Rotation to 45 deg bilaterally  Leg strength: Quad: 5/5 Hamstring: 5/5 Hip flexor: 5/5 Hip abductors: 5/5  Strength at foot: Plantar-flexion: 5/5 Dorsi-flexion: 5/5 Eversion: 5/5 Inversion: 5/5  Sensory change: Gross sensation intact to all lumbar and sacral dermatomes.  Reflexes: 2+ at both patellar tendons, 2+ at achilles tendons, Babinski's downgoing.  Gait unremarkable. SLR laying: Negative  XSLR laying: Negative  FABER: negative. Bilateral logroll negative Right hip internal rotation decreased compared to left  Neck: Inspection unremarkable. No palpable stepoffs. Negative Spurling's maneuver. Full neck range of motion Grip strength and sensation normal in bilateral hands Strength good C4 to T1 distribution No sensory change to C4 to T1 Negative Hoffman sign bilaterally Reflexes normal   ASSESSMENT & PLAN:   Left-sided low back pain with left-sided sciatica He has not been responding to treatment for it she'll tuberosity bursitis. Other sources of this pain that he is having could them from his low back. He does have L4-L5 disc degeneration. We will get an MRI of his lumbar spine and he will follow-up after the MRI is completed to review the results. Did review potential plans which could include: Medications, physical therapy, epidural spinal injections versus facet injections, and surgery.  We'll place him on a longer term prednisone Dosepak.  Neck pain His pain and sounds as though it is coming from his neck and not his shoulder. We will get x-rays of his neck. We'll put him on a longer prednisone Dosepak.

## 2017-02-08 NOTE — Assessment & Plan Note (Addendum)
His pain and sounds as though it is coming from his neck and not his shoulder. We will get x-rays of his neck. We'll put him on a longer prednisone Dosepak.

## 2017-02-18 ENCOUNTER — Ambulatory Visit
Admission: RE | Admit: 2017-02-18 | Discharge: 2017-02-18 | Disposition: A | Discharge status: Home / Self Care | Payer: BLUE CROSS/BLUE SHIELD | Source: Ambulatory Visit | Attending: Student | Admitting: Student

## 2017-02-18 DIAGNOSIS — M5442 Lumbago with sciatica, left side: Secondary | ICD-10-CM

## 2017-02-18 DIAGNOSIS — M48061 Spinal stenosis, lumbar region without neurogenic claudication: Secondary | ICD-10-CM | POA: Diagnosis not present

## 2017-02-21 ENCOUNTER — Encounter: Payer: Self-pay | Admitting: Student

## 2017-02-21 ENCOUNTER — Ambulatory Visit (INDEPENDENT_AMBULATORY_CARE_PROVIDER_SITE_OTHER): Payer: BLUE CROSS/BLUE SHIELD | Admitting: Student

## 2017-02-21 VITALS — BP 116/77 | Ht 71.0 in | Wt 221.0 lb

## 2017-02-21 DIAGNOSIS — M5442 Lumbago with sciatica, left side: Secondary | ICD-10-CM | POA: Diagnosis not present

## 2017-02-21 DIAGNOSIS — G8929 Other chronic pain: Secondary | ICD-10-CM

## 2017-02-21 DIAGNOSIS — M5126 Other intervertebral disc displacement, lumbar region: Secondary | ICD-10-CM

## 2017-02-21 DIAGNOSIS — M47812 Spondylosis without myelopathy or radiculopathy, cervical region: Secondary | ICD-10-CM

## 2017-02-21 MED ORDER — MELOXICAM 15 MG PO TABS: 15.0000 mg | ORAL_TABLET | Freq: Every day | ORAL | 1 refills | Status: AC

## 2017-02-21 NOTE — Patient Instructions (Signed)
Dr Meredith Staggers [facility] [address] [place]  [phone]  Monday 03/12/17 at 930a Arrival time is 915a

## 2017-02-21 NOTE — Assessment & Plan Note (Signed)
Cervical spine x-rays show arthritis at C5-C6. Recommend stretching and exercises for this. Mobic was prescribed to help with his arthritic pain. Follow-up if symptoms worsen. Do not believe he needs MRI imaging at this time.

## 2017-02-21 NOTE — Assessment & Plan Note (Signed)
Reviewed MRI results. Reviewed options for treatment plan. Because of his symptoms and the results seen on a MRI, believe he would benefit from either epidural spinal injection versus facet injection. Referral was placed to Dr. Maurice Small at [facility] for in interventional spinal injection. He will follow-up either with Dr. Maurice Small or myself 2-3 weeks after the injection.  Continue exercising and stretching in the meantime.

## 2017-02-21 NOTE — Progress Notes (Signed)
  Robert Wyatt - 56 y.o. male MRN [id number]  Date of birth: 1961-10-22  SUBJECTIVE:  Including CC & ROS.  CC: Low back and neck pain  Maxten presents to review his MRI results for his low back and go over his cervical spine x-rays.  Regards his low back he has L4-L5 disc bulge include crouching on a nerve potentially, along with facet arthritis with possible neural compression on the left as well. He is having radicular pain down his left leg.  He has been trying to do exercises and work with therapist at [facility] without much relief.  Usually prednisone helps but it did not help this time as much. It only helped a little bit. He is getting the MRI to assess for any disease process that could benefit from intervention such as an epidural spinal injection or facet injection versus surgery. He would not like to the surgical route if possible.   ROS: No unexpected weight loss, fever, chills, swelling, instability, muscle pain, numbness/tingling, redness, otherwise see HPI   PMHx - Updated and reviewed.  Contributory factors include: Negative PSHx - Updated and reviewed.  Contributory factors include:  Negative FHx - Updated and reviewed.  Contributory factors include:  Negative Social Hx - Updated and reviewed. Contributory factors include: Negative Medications - reviewed   DATA REVIEWED: MRI of his lumbar spine which shows small disc herniations at L2-L3 and L3-L4. L4-L5 has a moderate disc bulge with possible neural compression on the left and facet arthropathy on the left side at L4-L5 as well  PHYSICAL EXAM:  VS: BP:116/77  HR: bpm  TEMP: ( )  RESP:   HT:5\' 11"  (180.3 cm)   WT:221 lb (100.2 kg)  BMI:30.9 PHYSICAL EXAM: Gen: NAD, alert, cooperative with exam, well-appearing HEENT: clear conjunctiva,  CV:  no edema, capillary refill brisk, normal rate Resp: non-labored Skin: no rashes, normal turgor  Neuro: no gross deficits.  Psych:  alert and oriented   ASSESSMENT & PLAN:    Herniated lumbar intervertebral disc Reviewed MRI results. Reviewed options for treatment plan. Because of his symptoms and the results seen on a MRI, believe he would benefit from either epidural spinal injection versus facet injection. Referral was placed to Dr. Maurice Small at [facility] for in interventional spinal injection. He will follow-up either with Dr. Maurice Small or myself 2-3 weeks after the injection.  Continue exercising and stretching in the meantime.  Degenerative arthritis of cervical spine Cervical spine x-rays show arthritis at C5-C6. Recommend stretching and exercises for this. Mobic was prescribed to help with his arthritic pain. Follow-up if symptoms worsen. Do not believe he needs MRI imaging at this time.

## 2017-03-12 DIAGNOSIS — M545 Low back pain: Secondary | ICD-10-CM | POA: Diagnosis not present

## 2017-03-12 DIAGNOSIS — M5116 Intervertebral disc disorders with radiculopathy, lumbar region: Secondary | ICD-10-CM | POA: Diagnosis not present

## 2017-03-23 DIAGNOSIS — M5116 Intervertebral disc disorders with radiculopathy, lumbar region: Secondary | ICD-10-CM | POA: Diagnosis not present

## 2017-03-23 DIAGNOSIS — M545 Low back pain: Secondary | ICD-10-CM | POA: Diagnosis not present

## 2017-04-10 DIAGNOSIS — M5116 Intervertebral disc disorders with radiculopathy, lumbar region: Secondary | ICD-10-CM | POA: Diagnosis not present

## 2017-04-10 DIAGNOSIS — M545 Low back pain: Secondary | ICD-10-CM | POA: Diagnosis not present

## 2017-04-12 ENCOUNTER — Other Ambulatory Visit: Payer: Self-pay | Admitting: Student

## 2017-04-24 DIAGNOSIS — M47817 Spondylosis without myelopathy or radiculopathy, lumbosacral region: Secondary | ICD-10-CM | POA: Diagnosis not present

## 2017-04-24 DIAGNOSIS — M545 Low back pain: Secondary | ICD-10-CM | POA: Diagnosis not present

## 2017-05-22 DIAGNOSIS — M545 Low back pain: Secondary | ICD-10-CM | POA: Diagnosis not present

## 2017-05-22 DIAGNOSIS — M47817 Spondylosis without myelopathy or radiculopathy, lumbosacral region: Secondary | ICD-10-CM | POA: Diagnosis not present

## 2017-08-07 DIAGNOSIS — S46211A Strain of muscle, fascia and tendon of other parts of biceps, right arm, initial encounter: Secondary | ICD-10-CM | POA: Diagnosis not present

## 2017-08-11 DIAGNOSIS — M25521 Pain in right elbow: Secondary | ICD-10-CM | POA: Diagnosis not present

## 2017-08-16 DIAGNOSIS — M66821 Spontaneous rupture of other tendons, right upper arm: Secondary | ICD-10-CM | POA: Diagnosis not present

## 2017-08-17 DIAGNOSIS — Z23 Encounter for immunization: Secondary | ICD-10-CM | POA: Diagnosis not present

## 2017-08-27 DIAGNOSIS — M66821 Spontaneous rupture of other tendons, right upper arm: Secondary | ICD-10-CM | POA: Diagnosis not present

## 2017-09-11 DIAGNOSIS — M9902 Segmental and somatic dysfunction of thoracic region: Secondary | ICD-10-CM | POA: Diagnosis not present

## 2017-09-11 DIAGNOSIS — M542 Cervicalgia: Secondary | ICD-10-CM | POA: Diagnosis not present

## 2017-09-11 DIAGNOSIS — M9901 Segmental and somatic dysfunction of cervical region: Secondary | ICD-10-CM | POA: Diagnosis not present

## 2017-09-11 DIAGNOSIS — S134XXA Sprain of ligaments of cervical spine, initial encounter: Secondary | ICD-10-CM | POA: Diagnosis not present

## 2017-09-12 DIAGNOSIS — S134XXA Sprain of ligaments of cervical spine, initial encounter: Secondary | ICD-10-CM | POA: Diagnosis not present

## 2017-09-12 DIAGNOSIS — M9901 Segmental and somatic dysfunction of cervical region: Secondary | ICD-10-CM | POA: Diagnosis not present

## 2017-09-12 DIAGNOSIS — M9902 Segmental and somatic dysfunction of thoracic region: Secondary | ICD-10-CM | POA: Diagnosis not present

## 2017-09-12 DIAGNOSIS — M542 Cervicalgia: Secondary | ICD-10-CM | POA: Diagnosis not present

## 2017-09-26 DIAGNOSIS — M542 Cervicalgia: Secondary | ICD-10-CM | POA: Diagnosis not present

## 2017-09-26 DIAGNOSIS — M66821 Spontaneous rupture of other tendons, right upper arm: Secondary | ICD-10-CM | POA: Diagnosis not present

## 2017-10-03 DIAGNOSIS — R531 Weakness: Secondary | ICD-10-CM | POA: Diagnosis not present

## 2017-10-03 DIAGNOSIS — M542 Cervicalgia: Secondary | ICD-10-CM | POA: Diagnosis not present

## 2017-10-03 DIAGNOSIS — M66821 Spontaneous rupture of other tendons, right upper arm: Secondary | ICD-10-CM | POA: Diagnosis not present

## 2017-10-19 DIAGNOSIS — M25521 Pain in right elbow: Secondary | ICD-10-CM | POA: Diagnosis not present

## 2018-01-03 DIAGNOSIS — M542 Cervicalgia: Secondary | ICD-10-CM | POA: Diagnosis not present

## 2018-01-08 DIAGNOSIS — M542 Cervicalgia: Secondary | ICD-10-CM | POA: Diagnosis not present

## 2018-01-31 DIAGNOSIS — M47812 Spondylosis without myelopathy or radiculopathy, cervical region: Secondary | ICD-10-CM | POA: Diagnosis not present

## 2018-02-21 DIAGNOSIS — R05 Cough: Secondary | ICD-10-CM | POA: Diagnosis not present

## 2018-02-21 DIAGNOSIS — K219 Gastro-esophageal reflux disease without esophagitis: Secondary | ICD-10-CM | POA: Diagnosis not present

## 2018-02-21 DIAGNOSIS — I10 Essential (primary) hypertension: Secondary | ICD-10-CM | POA: Diagnosis not present

## 2018-02-21 DIAGNOSIS — F411 Generalized anxiety disorder: Secondary | ICD-10-CM | POA: Diagnosis not present

## 2018-02-28 DIAGNOSIS — R6882 Decreased libido: Secondary | ICD-10-CM | POA: Diagnosis not present

## 2018-02-28 DIAGNOSIS — N5089 Other specified disorders of the male genital organs: Secondary | ICD-10-CM | POA: Diagnosis not present

## 2018-02-28 DIAGNOSIS — E569 Vitamin deficiency, unspecified: Secondary | ICD-10-CM | POA: Diagnosis not present

## 2018-02-28 DIAGNOSIS — E785 Hyperlipidemia, unspecified: Secondary | ICD-10-CM | POA: Diagnosis not present

## 2018-02-28 DIAGNOSIS — E039 Hypothyroidism, unspecified: Secondary | ICD-10-CM | POA: Diagnosis not present

## 2018-02-28 DIAGNOSIS — I10 Essential (primary) hypertension: Secondary | ICD-10-CM | POA: Diagnosis not present

## 2018-02-28 DIAGNOSIS — R5383 Other fatigue: Secondary | ICD-10-CM | POA: Diagnosis not present

## 2018-03-07 DIAGNOSIS — M47812 Spondylosis without myelopathy or radiculopathy, cervical region: Secondary | ICD-10-CM | POA: Diagnosis not present

## 2018-07-05 IMAGING — CR DG LUMBAR SPINE 2-3V
3 series · 3 of 3 positions shown · non-contrast
Comparison: None in PACs

CLINICAL DATA: Three months of low back pain radiating into the
left leg.

EXAM:
LUMBAR SPINE - 2-3 VIEW

[w lumbar spine ap]
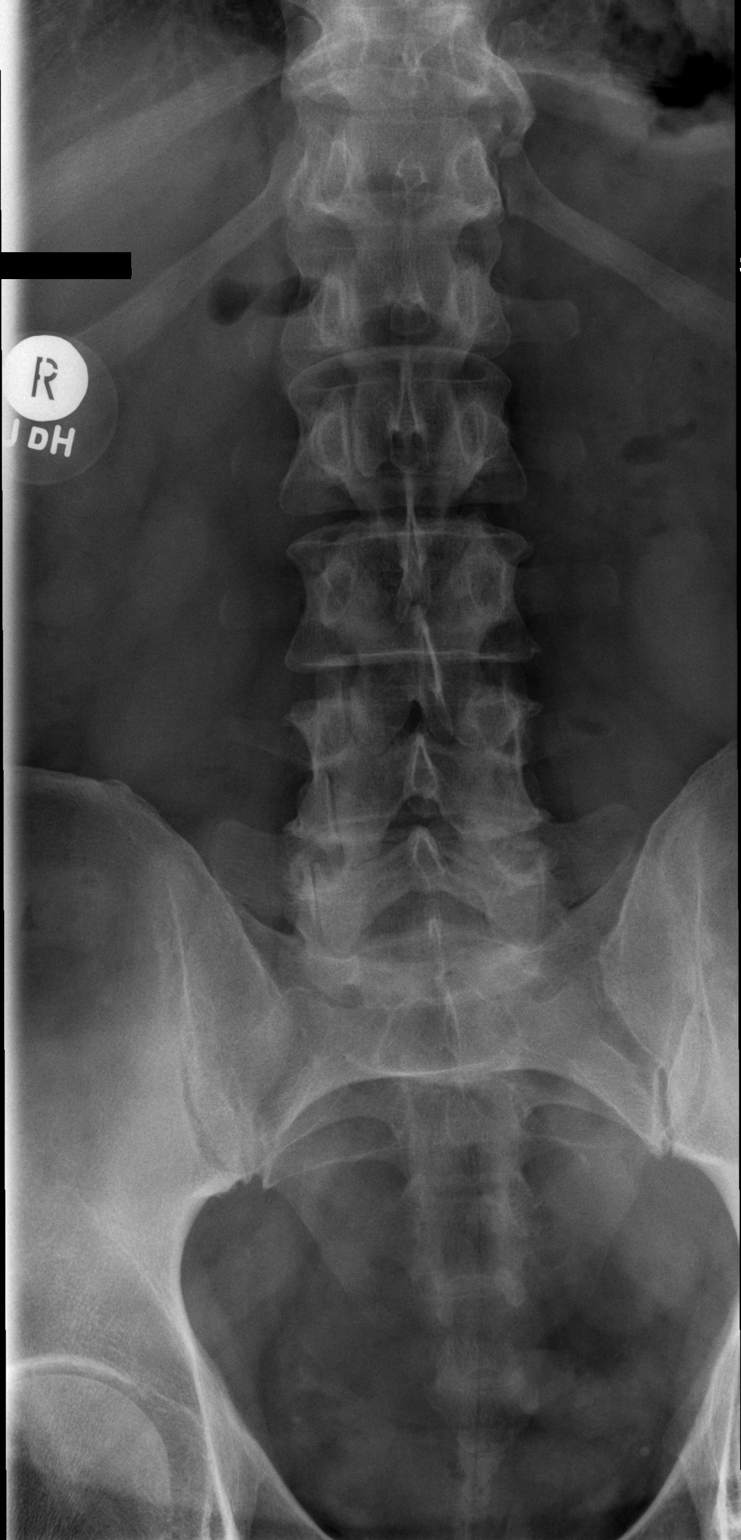

[w lumbar spine lat]
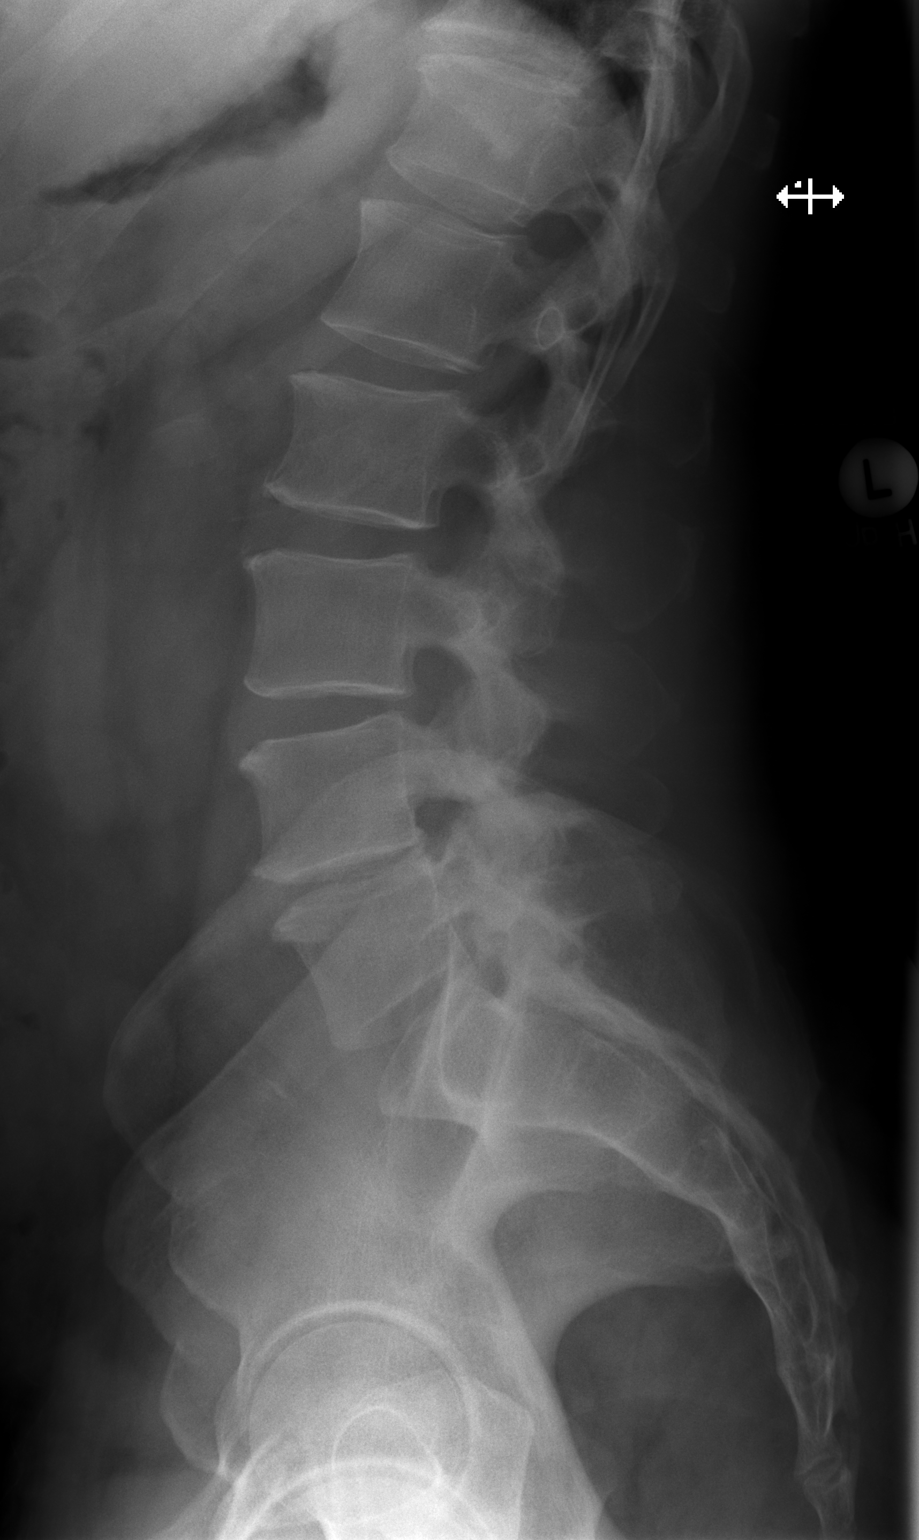

[w lumbar l-5 s-1 spot]
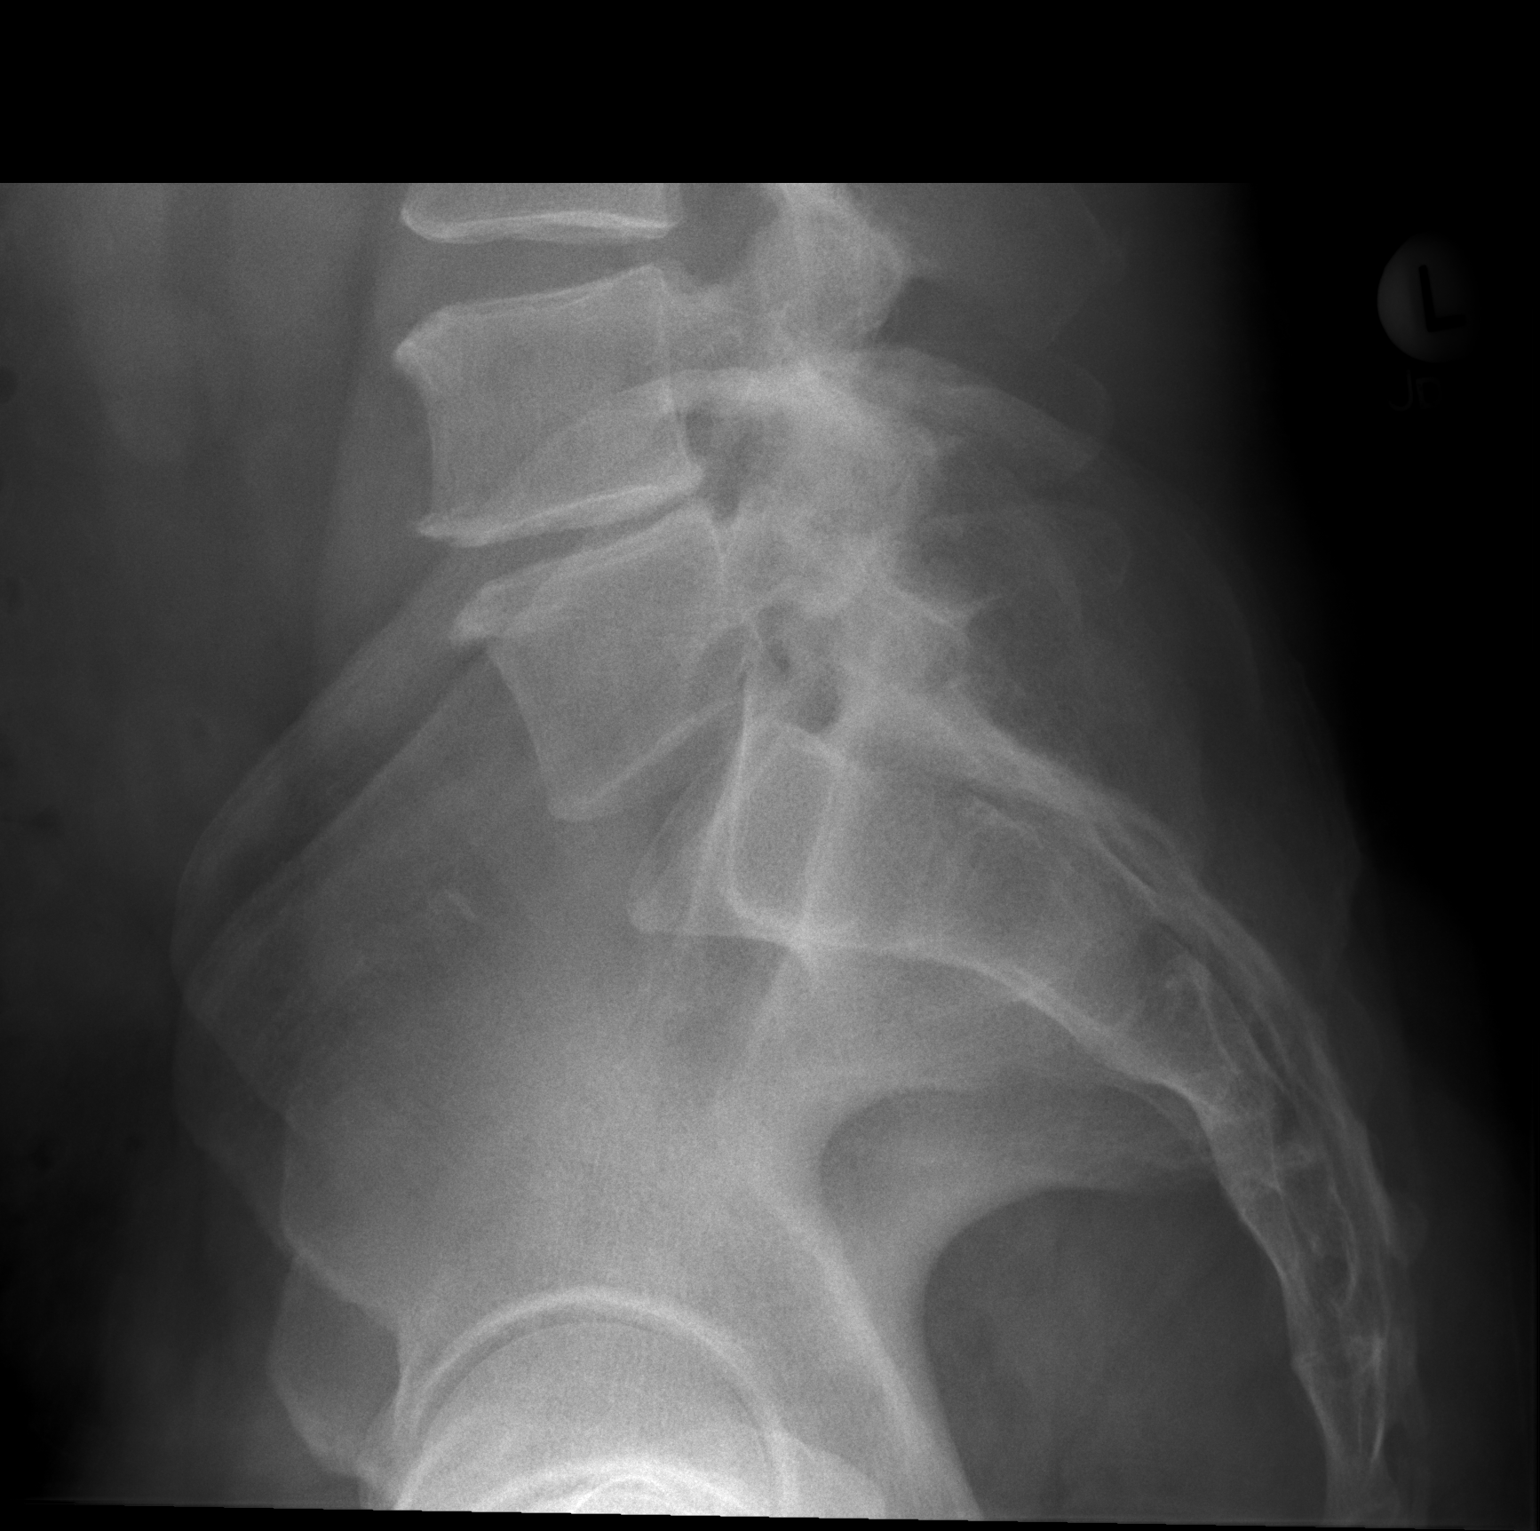

[3 of 3 positions shown; findings below may reference images not displayed]

FINDINGS: The lumbar vertebral bodies are preserved in height. There is
moderate disc space narrowing at L4-5. There is no
spondylolisthesis. The pedicles and transverse processes are intact.
The observed portions of the sacrum are normal.
IMPRESSION: Moderate degenerative disc space narrowing at L4-5 with anterior
endplate osteophytes. No compression fracture or spondylolisthesis.
Given the radicular symptoms, lumbar spine MRI may be a useful next
imaging step.

## 2019-07-29 DIAGNOSIS — Z23 Encounter for immunization: Secondary | ICD-10-CM | POA: Diagnosis not present

## 2019-08-13 DIAGNOSIS — M66821 Spontaneous rupture of other tendons, right upper arm: Secondary | ICD-10-CM | POA: Diagnosis not present

## 2020-03-11 DIAGNOSIS — R5383 Other fatigue: Secondary | ICD-10-CM | POA: Diagnosis not present

## 2020-03-11 DIAGNOSIS — E539 Vitamin B deficiency, unspecified: Secondary | ICD-10-CM | POA: Diagnosis not present

## 2020-03-11 DIAGNOSIS — I10 Essential (primary) hypertension: Secondary | ICD-10-CM | POA: Diagnosis not present

## 2020-03-11 DIAGNOSIS — N5089 Other specified disorders of the male genital organs: Secondary | ICD-10-CM | POA: Diagnosis not present

## 2020-03-11 DIAGNOSIS — E291 Testicular hypofunction: Secondary | ICD-10-CM | POA: Diagnosis not present

## 2020-03-11 DIAGNOSIS — E785 Hyperlipidemia, unspecified: Secondary | ICD-10-CM | POA: Diagnosis not present

## 2020-03-11 DIAGNOSIS — Z131 Encounter for screening for diabetes mellitus: Secondary | ICD-10-CM | POA: Diagnosis not present

## 2020-03-11 DIAGNOSIS — E039 Hypothyroidism, unspecified: Secondary | ICD-10-CM | POA: Diagnosis not present

## 2020-03-11 DIAGNOSIS — E559 Vitamin D deficiency, unspecified: Secondary | ICD-10-CM | POA: Diagnosis not present

## 2020-04-09 DIAGNOSIS — R7309 Other abnormal glucose: Secondary | ICD-10-CM | POA: Diagnosis not present

## 2020-04-09 DIAGNOSIS — E785 Hyperlipidemia, unspecified: Secondary | ICD-10-CM | POA: Diagnosis not present

## 2020-04-09 DIAGNOSIS — R5383 Other fatigue: Secondary | ICD-10-CM | POA: Diagnosis not present

## 2020-04-09 DIAGNOSIS — E039 Hypothyroidism, unspecified: Secondary | ICD-10-CM | POA: Diagnosis not present

## 2020-06-29 DIAGNOSIS — R6882 Decreased libido: Secondary | ICD-10-CM | POA: Diagnosis not present

## 2020-06-29 DIAGNOSIS — R7989 Other specified abnormal findings of blood chemistry: Secondary | ICD-10-CM | POA: Diagnosis not present

## 2020-06-29 DIAGNOSIS — E291 Testicular hypofunction: Secondary | ICD-10-CM | POA: Diagnosis not present

## 2020-06-29 DIAGNOSIS — R7309 Other abnormal glucose: Secondary | ICD-10-CM | POA: Diagnosis not present

## 2020-06-29 DIAGNOSIS — E785 Hyperlipidemia, unspecified: Secondary | ICD-10-CM | POA: Diagnosis not present

## 2020-06-29 DIAGNOSIS — K219 Gastro-esophageal reflux disease without esophagitis: Secondary | ICD-10-CM | POA: Diagnosis not present

## 2020-06-29 DIAGNOSIS — R5383 Other fatigue: Secondary | ICD-10-CM | POA: Diagnosis not present

## 2020-07-09 DIAGNOSIS — E785 Hyperlipidemia, unspecified: Secondary | ICD-10-CM | POA: Diagnosis not present

## 2020-07-09 DIAGNOSIS — I10 Essential (primary) hypertension: Secondary | ICD-10-CM | POA: Diagnosis not present

## 2020-07-09 DIAGNOSIS — E039 Hypothyroidism, unspecified: Secondary | ICD-10-CM | POA: Diagnosis not present

## 2020-07-09 DIAGNOSIS — R5383 Other fatigue: Secondary | ICD-10-CM | POA: Diagnosis not present

## 2020-08-05 DIAGNOSIS — Z23 Encounter for immunization: Secondary | ICD-10-CM | POA: Diagnosis not present

## 2020-09-29 DIAGNOSIS — M25512 Pain in left shoulder: Secondary | ICD-10-CM | POA: Diagnosis not present

## 2020-09-29 DIAGNOSIS — M25511 Pain in right shoulder: Secondary | ICD-10-CM | POA: Diagnosis not present

## 2020-10-20 DIAGNOSIS — M25511 Pain in right shoulder: Secondary | ICD-10-CM | POA: Diagnosis not present

## 2020-10-25 DIAGNOSIS — M25511 Pain in right shoulder: Secondary | ICD-10-CM | POA: Diagnosis not present

## 2020-10-27 DIAGNOSIS — M25511 Pain in right shoulder: Secondary | ICD-10-CM | POA: Diagnosis not present

## 2020-10-28 DIAGNOSIS — M66821 Spontaneous rupture of other tendons, right upper arm: Secondary | ICD-10-CM | POA: Diagnosis not present

## 2020-10-28 DIAGNOSIS — S46011D Strain of muscle(s) and tendon(s) of the rotator cuff of right shoulder, subsequent encounter: Secondary | ICD-10-CM | POA: Diagnosis not present

## 2020-10-28 DIAGNOSIS — M25511 Pain in right shoulder: Secondary | ICD-10-CM | POA: Diagnosis not present

## 2020-10-28 DIAGNOSIS — M6281 Muscle weakness (generalized): Secondary | ICD-10-CM | POA: Diagnosis not present

## 2020-11-03 DIAGNOSIS — E291 Testicular hypofunction: Secondary | ICD-10-CM | POA: Diagnosis not present

## 2020-11-24 DIAGNOSIS — L03012 Cellulitis of left finger: Secondary | ICD-10-CM | POA: Diagnosis not present

## 2020-12-30 DIAGNOSIS — E785 Hyperlipidemia, unspecified: Secondary | ICD-10-CM | POA: Diagnosis not present

## 2020-12-30 DIAGNOSIS — R5383 Other fatigue: Secondary | ICD-10-CM | POA: Diagnosis not present

## 2020-12-30 DIAGNOSIS — I10 Essential (primary) hypertension: Secondary | ICD-10-CM | POA: Diagnosis not present

## 2020-12-30 DIAGNOSIS — E039 Hypothyroidism, unspecified: Secondary | ICD-10-CM | POA: Diagnosis not present

## 2021-01-05 DIAGNOSIS — M25511 Pain in right shoulder: Secondary | ICD-10-CM | POA: Diagnosis not present

## 2021-06-15 DIAGNOSIS — F411 Generalized anxiety disorder: Secondary | ICD-10-CM | POA: Diagnosis not present

## 2021-06-15 DIAGNOSIS — I10 Essential (primary) hypertension: Secondary | ICD-10-CM | POA: Diagnosis not present

## 2021-06-15 DIAGNOSIS — N529 Male erectile dysfunction, unspecified: Secondary | ICD-10-CM | POA: Diagnosis not present

## 2021-06-15 DIAGNOSIS — K219 Gastro-esophageal reflux disease without esophagitis: Secondary | ICD-10-CM | POA: Diagnosis not present

## 2021-08-01 DIAGNOSIS — M25522 Pain in left elbow: Secondary | ICD-10-CM | POA: Diagnosis not present

## 2021-08-18 DIAGNOSIS — Z23 Encounter for immunization: Secondary | ICD-10-CM | POA: Diagnosis not present

## 2021-10-28 DIAGNOSIS — M25562 Pain in left knee: Secondary | ICD-10-CM | POA: Diagnosis not present

## 2021-10-28 DIAGNOSIS — M25561 Pain in right knee: Secondary | ICD-10-CM | POA: Diagnosis not present

## 2021-12-13 DIAGNOSIS — Z0000 Encounter for general adult medical examination without abnormal findings: Secondary | ICD-10-CM | POA: Diagnosis not present

## 2021-12-20 DIAGNOSIS — Z0000 Encounter for general adult medical examination without abnormal findings: Secondary | ICD-10-CM | POA: Diagnosis not present

## 2021-12-20 DIAGNOSIS — I10 Essential (primary) hypertension: Secondary | ICD-10-CM | POA: Diagnosis not present

## 2021-12-21 ENCOUNTER — Other Ambulatory Visit: Payer: Self-pay | Admitting: Registered Nurse

## 2021-12-21 DIAGNOSIS — Z0000 Encounter for general adult medical examination without abnormal findings: Secondary | ICD-10-CM

## 2021-12-21 DIAGNOSIS — R748 Abnormal levels of other serum enzymes: Secondary | ICD-10-CM

## 2021-12-21 DIAGNOSIS — Z8249 Family history of ischemic heart disease and other diseases of the circulatory system: Secondary | ICD-10-CM

## 2021-12-22 ENCOUNTER — Other Ambulatory Visit: Payer: Self-pay | Admitting: Registered Nurse

## 2021-12-22 DIAGNOSIS — Z8249 Family history of ischemic heart disease and other diseases of the circulatory system: Secondary | ICD-10-CM

## 2021-12-26 ENCOUNTER — Other Ambulatory Visit: Payer: Self-pay

## 2021-12-26 ENCOUNTER — Ambulatory Visit
Admission: RE | Admit: 2021-12-26 | Discharge: 2021-12-26 | Disposition: A | Discharge status: Home / Self Care | Payer: BC Managed Care – PPO | Source: Ambulatory Visit | Attending: Registered Nurse | Admitting: Registered Nurse

## 2021-12-26 DIAGNOSIS — Z8249 Family history of ischemic heart disease and other diseases of the circulatory system: Secondary | ICD-10-CM

## 2021-12-26 DIAGNOSIS — K7689 Other specified diseases of liver: Secondary | ICD-10-CM | POA: Diagnosis not present

## 2021-12-26 DIAGNOSIS — Z0000 Encounter for general adult medical examination without abnormal findings: Secondary | ICD-10-CM

## 2021-12-26 DIAGNOSIS — R748 Abnormal levels of other serum enzymes: Secondary | ICD-10-CM | POA: Diagnosis not present

## 2022-01-12 ENCOUNTER — Ambulatory Visit
Admission: RE | Admit: 2022-01-12 | Discharge: 2022-01-12 | Disposition: A | Discharge status: Home / Self Care | Payer: No Typology Code available for payment source | Source: Ambulatory Visit | Attending: Registered Nurse | Admitting: Registered Nurse

## 2022-01-12 DIAGNOSIS — Z8249 Family history of ischemic heart disease and other diseases of the circulatory system: Secondary | ICD-10-CM

## 2022-01-12 DIAGNOSIS — E785 Hyperlipidemia, unspecified: Secondary | ICD-10-CM | POA: Diagnosis not present

## 2022-01-25 ENCOUNTER — Ambulatory Visit: Payer: BC Managed Care – PPO | Admitting: Cardiology

## 2022-01-25 ENCOUNTER — Encounter: Payer: Self-pay | Admitting: Cardiology

## 2022-01-25 VITALS — BP 125/82 | HR 62 | Temp 98.0°F | Resp 16 | Ht 71.0 in | Wt 218.0 lb

## 2022-01-25 DIAGNOSIS — E782 Mixed hyperlipidemia: Secondary | ICD-10-CM | POA: Diagnosis not present

## 2022-01-25 DIAGNOSIS — Z8249 Family history of ischemic heart disease and other diseases of the circulatory system: Secondary | ICD-10-CM | POA: Diagnosis not present

## 2022-01-25 DIAGNOSIS — R072 Precordial pain: Secondary | ICD-10-CM | POA: Insufficient documentation

## 2022-01-25 DIAGNOSIS — I7121 Aneurysm of the ascending aorta, without rupture: Secondary | ICD-10-CM | POA: Insufficient documentation

## 2022-01-25 DIAGNOSIS — R931 Abnormal findings on diagnostic imaging of heart and coronary circulation: Secondary | ICD-10-CM | POA: Diagnosis not present

## 2022-01-25 MED ORDER — ATORVASTATIN CALCIUM 40 MG PO TABS: 40.0000 mg | ORAL_TABLET | Freq: Every day | ORAL | 3 refills | Status: DC

## 2022-01-25 NOTE — Progress Notes (Signed)
? ? ?Patient referred by Deland Pretty, MD for coronary calcification ? ?Subjective:  ? ?Robert Wyatt, male    DOB: 1961-10-21, 61 y.o.   MRN: [id number] ? ? ?Chief Complaint  ?Patient presents with  ? aortic valve  ? dilated thoracic  ? New Patient (Initial Visit)  ? ? ? ?HPI ? ?61 y.o. Caucasian male with elevated coronary calcium score, thoracic aorta aneurysm, family h/o CAD ? ?Patient is here with his wife today. He works with [employer], building cars. He stays active with his work, but does not do any regular physical activity. ? ?Recent CT cardiac scoring scan showed aortic valve calcification, dilation of proximal ascending aorta, and coronary calcium score in 85th percentile. He has strong family h/o CAD with father having had MI at 30, brother at 67. His father also has h/o aorta aneurysm, details now known.  ? ? ?Past Medical History:  ?Diagnosis Date  ? Arthritis   ? GERD (gastroesophageal reflux disease)   ? Hearing decreased   ? ? ? ?Past Surgical History:  ?Procedure Laterality Date  ? HAND SURGERY    ? age 39-cut rt palm-nerve   ? INSERTION OF MESH N/A 06/09/2014  ? Procedure: INSERTION OF MESH;  Surgeon: Harl Bowie, MD;  Location: [facility];  Service: General;  Laterality: N/A;  ? UMBILICAL HERNIA REPAIR N/A 06/09/2014  ? Procedure: UMBILICAL HERNIA REPAIR WITH MESH;  Surgeon: Harl Bowie, MD;  Location: [facility];  Service: General;  Laterality: N/A;  ? ? ? ?Social History  ? ?Tobacco Use  ?Smoking Status Never  ?Smokeless Tobacco Never  ? ? ?Social History  ? ?Substance and Sexual Activity  ?Alcohol Use Yes  ? Comment: occ  ? ? ? ?Family History  ?Problem Relation Age of Onset  ? COPD Mother   ? Heart attack Father 33  ? Heart attack Brother 4  ? ? ? ? ?Current Outpatient Medications:  ?  atorvastatin (LIPITOR) 40 MG tablet, , Disp: , Rfl: 0 ?  Cholecalciferol (VITAMIN D3) 125 MCG (5000 UT) TABS, Take by mouth., Disp: , Rfl:  ?  Coenzyme Q10  (COQ10) 200 MG CAPS, Take by mouth., Disp: , Rfl:  ?  ibuprofen (ADVIL,MOTRIN) 100 MG chewable tablet, Chew by mouth every 8 (eight) hours as needed., Disp: , Rfl:  ?  losartan-hydrochlorothiazide (HYZAAR) 50-12.5 MG tablet, , Disp: , Rfl: 0 ?  meloxicam (MOBIC) 15 MG tablet, Take 1 tablet (15 mg total) by mouth daily., Disp: 30 tablet, Rfl: 1 ?  mupirocin ointment (BACTROBAN) 2 %, Apply to toe TID for 5 days, Disp: 22 g, Rfl: 0 ?  Omega-3 Fatty Acids (FISH OIL) 1000 MG CPDR, Take by mouth., Disp: , Rfl:  ?  omeprazole (PRILOSEC) 20 MG capsule, , Disp: , Rfl: 0 ?  Prasterone, DHEA, 25 MG CAPS, Take by mouth., Disp: , Rfl:  ?  sertraline (ZOLOFT) 50 MG tablet, , Disp: , Rfl: 0 ? ? ?Cardiovascular and other pertinent studies: ? ?Reviewed external labs and tests, independently interpreted ? ?EKG 01/25/2022: ?Sinus rhythm 58 bpm ?Incomplete RBBB ? ?CT cardiac scoring 01/12/2022: ?1. Coronary calcium score of 318.6 is at the 85th percentile for the ?patient's age, sex and race. ?LM: 41.7 ?LAD: 121 ?LCx: 2.9 ?RCA: 153 ? 2. Focal aortic valvular calcification. Correlation with ?echocardiography may be helpful especially given proximal aortic ?dilatation. ?3. Mild aneurysmal dilatation of the ascending thoracic aorta ?measuring up to 4.1 cm in maximum caliber.  Recommend annual imaging ?followup by CTA or MRA. This recommendation follows 2010 ?ACCF/AHA/AATS/ACR/ASA/SCA/SCAI/SIR/STS/SVM Guidelines for the ?Diagnosis and Management of Patients with Thoracic Aortic Disease. ?Circulation. 2010; 121: V672-C947. Aortic aneurysm NOS (ICD10-I71.9) ?  ? ?Recent labs: ?12/13/2021: ?Glucose 107, BUN/Cr 22/0.76. EGFR 103. Na/K 139/4.3. AST/ALT 42/53. Rest of the CMP normal ?H/H 15/44. MCV 96. Platelets 268 ?HbA1C NA ?Chol 251, TG 130, HDL 47, LDL 180 ?TSH 0.6 normal ? ?11/2016: ?Chol 134, TG 129, HDL 37, LDL 71 ? ? ? ?Review of Systems  ?Cardiovascular:  Positive for chest pain (Atypical). Negative for dyspnea on exertion, leg swelling,  palpitations and syncope.  ? ?   ? ? ?Vitals:  ? 01/25/22 1330  ?BP: 125/82  ?Pulse: 62  ?Resp: 16  ?Temp: 98 ?F (36.7 ?C)  ?SpO2: 96%  ? ? ? ?Body mass index is 30.4 kg/m?. ?Filed Weights  ? 01/25/22 1330  ?Weight: 218 lb (98.9 kg)  ? ? ? ?Objective:  ? Physical Exam ?Vitals and nursing note reviewed.  ?Constitutional:   ?   General: He is not in acute distress. ?Neck:  ?   Vascular: No JVD.  ?Cardiovascular:  ?   Rate and Rhythm: Normal rate and regular rhythm.  ?   Heart sounds: Murmur heard.  ?Harsh midsystolic murmur is present with a grade of 1/6 at the upper right sternal border radiating to the neck.  ?Pulmonary:  ?   Effort: Pulmonary effort is normal.  ?   Breath sounds: Normal breath sounds. No wheezing or rales.  ?Musculoskeletal:  ?   Right lower leg: No edema.  ?   Left lower leg: No edema.  ? ? ? ?   ? ?Visit diagnoses: ?  ICD-10-CM   ?1. Elevated coronary artery calcium score  R93.1 EKG 12-Lead  ?  Lipid panel  ?  ?2. Aneurysm of ascending aorta without rupture (HCC)  I71.21 PCV ECHOCARDIOGRAM COMPLETE  ?  CT ANGIO CHEST AORTA W/CM & OR WO/CM  ?  ?3. Mixed hyperlipidemia  E78.2 Lipid panel  ?  Comprehensive Metabolic Panel (CMET)  ?  ?4. Family history of abdominal aortic aneurysm (AAA)  Z82.49 PCV AORTA DUPLEX  ?  ?5. Precordial pain  R07.2 PCV MYOCARDIAL PERFUSION WO LEXISCAN  ?  ?  ? ?Orders Placed This Encounter  ?Procedures  ? CT ANGIO CHEST AORTA W/CM & OR WO/CM  ? Lipid panel  ? Comprehensive Metabolic Panel (CMET)  ? PCV MYOCARDIAL PERFUSION WO LEXISCAN  ? EKG 12-Lead  ? PCV ECHOCARDIOGRAM COMPLETE  ? PCV AORTA DUPLEX  ?  ? ?Meds ordered this encounter  ?Medications  ? atorvastatin (LIPITOR) 40 MG tablet  ?  Sig: Take 1 tablet (40 mg total) by mouth daily.  ?  Dispense:  90 tablet  ?  Refill:  3  ?  ? ?Assessment & Recommendations:  ? ?61 y.o. Caucasian male with elevated coronary calcium score, thoracic aorta aneurysm, family h/o CAD ? ?Elevated coronary calcium score: ?85th percentile,  including left main.  Strong family history of coronary artery disease.  Chest pain symptoms are atypical.  Will obtain exercise nuclear stress testing for ischemia evaluation. ?Continue Lipitor 40 mg daily.  Repeat lipid panel, along with lipoprotein a in 3 months. ? 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   ?We will obtain echocardiogram. ?We will also obtain CT angiogram of aorta for surveillance in 6 months.  He would like to perform this in [place]. ? ?Avoid weight lifting >20 pounds. ?Continue losartan, as part of losartan/hydrochlorothiazide. ? ?Patient is overweight.  Given his coronary artery disease as described above, I do think would benefit from [place] for weight loss, in addition to diet and exercise. ? ?Further recommendations after above testing. ? ?Thank you for referring the patient to [place]. Please feel free to contact with any questions. ? ? ?Nigel Mormon, MD ?Pager: [phone] ?Office: [phone] ?

## 2022-02-13 ENCOUNTER — Ambulatory Visit: Payer: BC Managed Care – PPO

## 2022-02-13 DIAGNOSIS — R072 Precordial pain: Secondary | ICD-10-CM

## 2022-02-15 NOTE — Progress Notes (Signed)
Called and spoke to pt, pt voiced understanding.

## 2022-02-21 ENCOUNTER — Ambulatory Visit: Payer: BC Managed Care – PPO

## 2022-02-21 DIAGNOSIS — Z8249 Family history of ischemic heart disease and other diseases of the circulatory system: Secondary | ICD-10-CM

## 2022-02-21 DIAGNOSIS — I7121 Aneurysm of the ascending aorta, without rupture: Secondary | ICD-10-CM | POA: Diagnosis not present

## 2022-02-21 DIAGNOSIS — I714 Abdominal aortic aneurysm, without rupture, unspecified: Secondary | ICD-10-CM

## 2022-02-26 NOTE — Progress Notes (Signed)
Normal pumping function of the heart. No severe heart valve abnormalities noted. Specifically, aortic valve is normal, unlike what I suspected. ?Dilation of aorta similar to that seen on CT scan. Monitor for now.  ? ?Thanks ?MJP ? ?

## 2022-02-27 ENCOUNTER — Other Ambulatory Visit: Payer: Self-pay | Admitting: Cardiology

## 2022-02-27 DIAGNOSIS — I7121 Aneurysm of the ascending aorta, without rupture: Secondary | ICD-10-CM

## 2022-02-27 DIAGNOSIS — I739 Peripheral vascular disease, unspecified: Secondary | ICD-10-CM

## 2022-02-27 DIAGNOSIS — I714 Abdominal aortic aneurysm, without rupture, unspecified: Secondary | ICD-10-CM

## 2022-02-27 MED ORDER — ASPIRIN EC 81 MG PO TBEC: 81.0000 mg | DELAYED_RELEASE_TABLET | Freq: Every day | ORAL | 3 refills | Status: DC

## 2022-02-27 NOTE — Progress Notes (Signed)
Also recommend aspirin 81 mg given noted calcification in aorta. ? ?Thanks ?MJP ? ?

## 2022-02-27 NOTE — Progress Notes (Signed)
Spoke to patient he is aware of medication and voiced understanding

## 2022-02-27 NOTE — Progress Notes (Signed)
Called pt no answer left a vm to call back

## 2022-02-27 NOTE — Progress Notes (Signed)
Called pt to inform him about his results, pt understood

## 2022-02-27 NOTE — Progress Notes (Signed)
In addition, given family history of rupture, will also place a referral to vascular surgery. ? ?Thanks ?MJP ? ?

## 2022-02-27 NOTE — Progress Notes (Signed)
Called pt to inform him about his results, pt understood

## 2022-04-03 DIAGNOSIS — L570 Actinic keratosis: Secondary | ICD-10-CM | POA: Diagnosis not present

## 2022-04-03 DIAGNOSIS — D1801 Hemangioma of skin and subcutaneous tissue: Secondary | ICD-10-CM | POA: Diagnosis not present

## 2022-04-03 DIAGNOSIS — L821 Other seborrheic keratosis: Secondary | ICD-10-CM | POA: Diagnosis not present

## 2022-04-03 DIAGNOSIS — L814 Other melanin hyperpigmentation: Secondary | ICD-10-CM | POA: Diagnosis not present

## 2022-04-03 DIAGNOSIS — L720 Epidermal cyst: Secondary | ICD-10-CM | POA: Diagnosis not present

## 2022-05-05 DIAGNOSIS — M25552 Pain in left hip: Secondary | ICD-10-CM | POA: Diagnosis not present

## 2022-05-05 DIAGNOSIS — M25551 Pain in right hip: Secondary | ICD-10-CM | POA: Diagnosis not present

## 2022-05-05 DIAGNOSIS — M25511 Pain in right shoulder: Secondary | ICD-10-CM | POA: Diagnosis not present

## 2022-05-15 DIAGNOSIS — M25552 Pain in left hip: Secondary | ICD-10-CM | POA: Diagnosis not present

## 2022-05-15 DIAGNOSIS — M25551 Pain in right hip: Secondary | ICD-10-CM | POA: Diagnosis not present

## 2022-05-22 DIAGNOSIS — G8929 Other chronic pain: Secondary | ICD-10-CM | POA: Diagnosis not present

## 2022-05-22 DIAGNOSIS — M25511 Pain in right shoulder: Secondary | ICD-10-CM | POA: Diagnosis not present

## 2022-05-29 DIAGNOSIS — M25511 Pain in right shoulder: Secondary | ICD-10-CM | POA: Diagnosis not present

## 2022-06-28 DIAGNOSIS — M25512 Pain in left shoulder: Secondary | ICD-10-CM | POA: Diagnosis not present

## 2022-07-12 DIAGNOSIS — M1612 Unilateral primary osteoarthritis, left hip: Secondary | ICD-10-CM | POA: Diagnosis not present

## 2022-07-20 DIAGNOSIS — M1612 Unilateral primary osteoarthritis, left hip: Secondary | ICD-10-CM | POA: Diagnosis not present

## 2022-08-01 DIAGNOSIS — Z23 Encounter for immunization: Secondary | ICD-10-CM | POA: Diagnosis not present

## 2022-08-07 ENCOUNTER — Ambulatory Visit: Payer: BC Managed Care – PPO | Admitting: Cardiology

## 2022-08-09 DIAGNOSIS — M25511 Pain in right shoulder: Secondary | ICD-10-CM | POA: Diagnosis not present

## 2022-08-11 DIAGNOSIS — M5416 Radiculopathy, lumbar region: Secondary | ICD-10-CM | POA: Diagnosis not present

## 2022-08-11 DIAGNOSIS — M5459 Other low back pain: Secondary | ICD-10-CM | POA: Diagnosis not present

## 2022-08-15 DIAGNOSIS — M542 Cervicalgia: Secondary | ICD-10-CM | POA: Diagnosis not present

## 2022-08-23 HISTORY — PX: ROTATOR CUFF REPAIR: SHX139

## 2022-08-24 DIAGNOSIS — M25511 Pain in right shoulder: Secondary | ICD-10-CM | POA: Diagnosis not present

## 2022-08-24 DIAGNOSIS — M94211 Chondromalacia, right shoulder: Secondary | ICD-10-CM | POA: Diagnosis not present

## 2022-08-24 DIAGNOSIS — M75121 Complete rotator cuff tear or rupture of right shoulder, not specified as traumatic: Secondary | ICD-10-CM | POA: Diagnosis not present

## 2022-08-24 DIAGNOSIS — G8918 Other acute postprocedural pain: Secondary | ICD-10-CM | POA: Diagnosis not present

## 2022-08-24 DIAGNOSIS — M24111 Other articular cartilage disorders, right shoulder: Secondary | ICD-10-CM | POA: Diagnosis not present

## 2022-08-24 DIAGNOSIS — M65811 Other synovitis and tenosynovitis, right shoulder: Secondary | ICD-10-CM | POA: Diagnosis not present

## 2022-08-24 DIAGNOSIS — M94261 Chondromalacia, right knee: Secondary | ICD-10-CM | POA: Diagnosis not present

## 2022-08-24 DIAGNOSIS — M66321 Spontaneous rupture of flexor tendons, right upper arm: Secondary | ICD-10-CM | POA: Diagnosis not present

## 2022-08-24 DIAGNOSIS — M75101 Unspecified rotator cuff tear or rupture of right shoulder, not specified as traumatic: Secondary | ICD-10-CM | POA: Diagnosis not present

## 2022-08-24 DIAGNOSIS — M19011 Primary osteoarthritis, right shoulder: Secondary | ICD-10-CM | POA: Diagnosis not present

## 2022-08-24 DIAGNOSIS — M7541 Impingement syndrome of right shoulder: Secondary | ICD-10-CM | POA: Diagnosis not present

## 2022-09-06 DIAGNOSIS — M25511 Pain in right shoulder: Secondary | ICD-10-CM | POA: Diagnosis not present

## 2022-09-06 DIAGNOSIS — M6281 Muscle weakness (generalized): Secondary | ICD-10-CM | POA: Diagnosis not present

## 2022-09-06 DIAGNOSIS — M25611 Stiffness of right shoulder, not elsewhere classified: Secondary | ICD-10-CM | POA: Diagnosis not present

## 2022-09-07 ENCOUNTER — Ambulatory Visit: Payer: BC Managed Care – PPO | Admitting: Cardiology

## 2022-09-08 DIAGNOSIS — M25611 Stiffness of right shoulder, not elsewhere classified: Secondary | ICD-10-CM | POA: Diagnosis not present

## 2022-09-08 DIAGNOSIS — M6281 Muscle weakness (generalized): Secondary | ICD-10-CM | POA: Diagnosis not present

## 2022-09-08 DIAGNOSIS — M25511 Pain in right shoulder: Secondary | ICD-10-CM | POA: Diagnosis not present

## 2022-09-11 DIAGNOSIS — M5416 Radiculopathy, lumbar region: Secondary | ICD-10-CM | POA: Diagnosis not present

## 2022-09-11 DIAGNOSIS — M542 Cervicalgia: Secondary | ICD-10-CM | POA: Diagnosis not present

## 2022-09-12 DIAGNOSIS — M6281 Muscle weakness (generalized): Secondary | ICD-10-CM | POA: Diagnosis not present

## 2022-09-12 DIAGNOSIS — M25511 Pain in right shoulder: Secondary | ICD-10-CM | POA: Diagnosis not present

## 2022-09-12 DIAGNOSIS — M25611 Stiffness of right shoulder, not elsewhere classified: Secondary | ICD-10-CM | POA: Diagnosis not present

## 2022-09-18 DIAGNOSIS — M25611 Stiffness of right shoulder, not elsewhere classified: Secondary | ICD-10-CM | POA: Diagnosis not present

## 2022-09-18 DIAGNOSIS — M25511 Pain in right shoulder: Secondary | ICD-10-CM | POA: Diagnosis not present

## 2022-09-18 DIAGNOSIS — M6281 Muscle weakness (generalized): Secondary | ICD-10-CM | POA: Diagnosis not present

## 2022-09-19 DIAGNOSIS — M5416 Radiculopathy, lumbar region: Secondary | ICD-10-CM | POA: Diagnosis not present

## 2022-09-19 DIAGNOSIS — M47812 Spondylosis without myelopathy or radiculopathy, cervical region: Secondary | ICD-10-CM | POA: Diagnosis not present

## 2022-09-20 DIAGNOSIS — M25611 Stiffness of right shoulder, not elsewhere classified: Secondary | ICD-10-CM | POA: Diagnosis not present

## 2022-09-20 DIAGNOSIS — M6281 Muscle weakness (generalized): Secondary | ICD-10-CM | POA: Diagnosis not present

## 2022-09-20 DIAGNOSIS — M25511 Pain in right shoulder: Secondary | ICD-10-CM | POA: Diagnosis not present

## 2022-09-25 DIAGNOSIS — M25511 Pain in right shoulder: Secondary | ICD-10-CM | POA: Diagnosis not present

## 2022-09-25 DIAGNOSIS — M6281 Muscle weakness (generalized): Secondary | ICD-10-CM | POA: Diagnosis not present

## 2022-09-25 DIAGNOSIS — M25611 Stiffness of right shoulder, not elsewhere classified: Secondary | ICD-10-CM | POA: Diagnosis not present

## 2022-09-26 DIAGNOSIS — M47812 Spondylosis without myelopathy or radiculopathy, cervical region: Secondary | ICD-10-CM | POA: Diagnosis not present

## 2022-09-26 DIAGNOSIS — R931 Abnormal findings on diagnostic imaging of heart and coronary circulation: Secondary | ICD-10-CM | POA: Diagnosis not present

## 2022-09-26 DIAGNOSIS — E782 Mixed hyperlipidemia: Secondary | ICD-10-CM | POA: Diagnosis not present

## 2022-09-27 LAB — LIPID PANEL
Chol/HDL Ratio: 4.1 ratio (ref 0.0–5.0)
Cholesterol, Total: 176 mg/dL (ref 100–199)
HDL: 43 mg/dL (ref >39)
LDL Chol Calc (NIH): 98 mg/dL (ref 0–99)
Triglycerides: 203 mg/dL — ABNORMAL HIGH (ref 0–149)
VLDL Cholesterol Cal: 35 mg/dL (ref 5–40)

## 2022-09-27 LAB — COMPREHENSIVE METABOLIC PANEL
ALT: 32 IU/L (ref 0–44)
AST: 24 IU/L (ref 0–40)
Albumin/Globulin Ratio: 2.6 — ABNORMAL HIGH (ref 1.2–2.2)
Albumin: 5.1 g/dL — ABNORMAL HIGH (ref 3.9–4.9)
Alkaline Phosphatase: 73 IU/L (ref 44–121)
BUN/Creatinine Ratio: 15 (ref 10–24)
BUN: 13 mg/dL (ref 8–27)
Bilirubin Total: 0.8 mg/dL (ref 0.0–1.2)
CO2: 21 mmol/L (ref 20–29)
Calcium: 9.6 mg/dL (ref 8.6–10.2)
Chloride: 101 mmol/L (ref 96–106)
Creatinine, Ser: 0.84 mg/dL (ref 0.76–1.27)
Globulin, Total: 2 g/dL (ref 1.5–4.5)
Glucose: 115 mg/dL — ABNORMAL HIGH (ref 70–99)
Potassium: 4 mmol/L (ref 3.5–5.2)
Sodium: 139 mmol/L (ref 134–144)
Total Protein: 7.1 g/dL (ref 6.0–8.5)
eGFR: 99 mL/min/{1.73_m2} (ref >59)

## 2022-09-27 LAB — LIPOPROTEIN A (LPA): Lipoprotein (a): 16 nmol/L (ref <75.0)

## 2022-09-28 DIAGNOSIS — M6281 Muscle weakness (generalized): Secondary | ICD-10-CM | POA: Diagnosis not present

## 2022-09-28 DIAGNOSIS — M5117 Intervertebral disc disorders with radiculopathy, lumbosacral region: Secondary | ICD-10-CM | POA: Diagnosis not present

## 2022-09-28 DIAGNOSIS — M48061 Spinal stenosis, lumbar region without neurogenic claudication: Secondary | ICD-10-CM | POA: Diagnosis not present

## 2022-09-28 DIAGNOSIS — M4727 Other spondylosis with radiculopathy, lumbosacral region: Secondary | ICD-10-CM | POA: Diagnosis not present

## 2022-09-28 DIAGNOSIS — M5116 Intervertebral disc disorders with radiculopathy, lumbar region: Secondary | ICD-10-CM | POA: Diagnosis not present

## 2022-09-28 DIAGNOSIS — M25511 Pain in right shoulder: Secondary | ICD-10-CM | POA: Diagnosis not present

## 2022-09-28 DIAGNOSIS — M5416 Radiculopathy, lumbar region: Secondary | ICD-10-CM | POA: Diagnosis not present

## 2022-09-28 DIAGNOSIS — M25611 Stiffness of right shoulder, not elsewhere classified: Secondary | ICD-10-CM | POA: Diagnosis not present

## 2022-09-29 ENCOUNTER — Encounter: Payer: Self-pay | Admitting: Cardiology

## 2022-09-29 ENCOUNTER — Ambulatory Visit: Payer: BC Managed Care – PPO | Admitting: Cardiology

## 2022-09-29 VITALS — BP 128/89 | HR 71 | Resp 16 | Ht 71.0 in | Wt 223.0 lb

## 2022-09-29 DIAGNOSIS — R931 Abnormal findings on diagnostic imaging of heart and coronary circulation: Secondary | ICD-10-CM | POA: Diagnosis not present

## 2022-09-29 DIAGNOSIS — I714 Abdominal aortic aneurysm, without rupture, unspecified: Secondary | ICD-10-CM | POA: Diagnosis not present

## 2022-09-29 DIAGNOSIS — E782 Mixed hyperlipidemia: Secondary | ICD-10-CM

## 2022-09-29 DIAGNOSIS — I7121 Aneurysm of the ascending aorta, without rupture: Secondary | ICD-10-CM

## 2022-09-29 MED ORDER — ATORVASTATIN CALCIUM 80 MG PO TABS: 80.0000 mg | ORAL_TABLET | Freq: Every day | ORAL | 3 refills | Status: DC

## 2022-09-29 NOTE — Progress Notes (Signed)
Patient referred by Deland Pretty, MD for coronary calcification  Subjective:   Robert Wyatt, male    DOB: 21-May-1961, 61 y.o.   MRN: [id number]   Chief Complaint  Patient presents with   Elevated coronary artery calcium score   Follow-up   Aneurysm of ascending aorta without rupture     HPI  61 y.o. Caucasian male with elevated coronary calcium score, thoracic aorta aneurysm, family h/o CAD  Patient has no cardiac complaints today. He has had multiple orthopedic surgeries recently, related to shoulder, elbow, spine etc. Reviewed recent test results with the patient, details below.   Initial consultation visit 02/2022: Patient is here with his wife today. He works with [employer], building cars. He stays active with his work, but does not do any regular physical activity.  Recent CT cardiac scoring scan showed aortic valve calcification, dilation of proximal ascending aorta, and coronary calcium score in 85th percentile. He has strong family h/o CAD with father having had MI at 28, brother at 13. His father also has h/o aorta aneurysm, details now known.     Current Outpatient Medications:    aspirin EC 81 MG tablet, Take 1 tablet (81 mg total) by mouth daily. Swallow whole., Disp: 90 tablet, Rfl: 3   atorvastatin (LIPITOR) 40 MG tablet, Take 1 tablet (40 mg total) by mouth daily., Disp: 90 tablet, Rfl: 3   Cholecalciferol (VITAMIN D3) 125 MCG (5000 UT) TABS, Take by mouth., Disp: , Rfl:    Coenzyme Q10 (COQ10) 200 MG CAPS, Take by mouth., Disp: , Rfl:    ibuprofen (ADVIL,MOTRIN) 100 MG chewable tablet, Chew by mouth every 8 (eight) hours as needed., Disp: , Rfl:    losartan-hydrochlorothiazide (HYZAAR) 50-12.5 MG tablet, , Disp: , Rfl: 0   meloxicam (MOBIC) 15 MG tablet, Take 1 tablet (15 mg total) by mouth daily., Disp: 30 tablet, Rfl: 1   mupirocin ointment (BACTROBAN) 2 %, Apply to toe TID for 5 days, Disp: 22 g, Rfl: 0   Omega-3 Fatty Acids (FISH OIL) 1000 MG  CPDR, Take by mouth., Disp: , Rfl:    omeprazole (PRILOSEC) 20 MG capsule, , Disp: , Rfl: 0   Prasterone, DHEA, 25 MG CAPS, Take by mouth., Disp: , Rfl:    sertraline (ZOLOFT) 50 MG tablet, , Disp: , Rfl: 0   Cardiovascular and other pertinent studies:  Reviewed external labs and tests, independently interpreted  EKG 09/29/2022: Sinus rhythm 62 bpm  Normal EKG  Echocardiogram 02/21/2022:  Normal LV systolic function with visual EF 60-65%. Left ventricle cavity  is normal in size. Normal left ventricular wall thickness. Normal global  wall motion. Normal diastolic filling pattern, normal LAP.  Left atrial cavity is normal in size. A lipomatous septum is present.  Native trileaflet aortic valve. Trace aortic regurgitation. Mild aortic  valve leaflet calcification.  Mild pulmonic regurgitation.  The aortic root is normal. Mildly dilated ascending aorta, 66m.  No prior study for comparison.   Exercise Sestamibi stress test 02/13/2022: Exercise nuclear stress test was performed using Bruce protocol. Patient reached 10.1 METS, and 89% of age predicted maximum heart rate. Exercise capacity was excellent. No chest pain reported. Heart rate and hemodynamic response were normal. Stress EKG revealed no ischemic changes. Normal myocardial perfusion. Stress LVEF 57%. Low risk study.   Abdominal Aortic Duplex 02/21/2022: Moderate dilatation of the abdominal aorta is noted in the proximal and mid aorta. An abdominal aortic aneurysm measuring 3.9 x 4 x 2.1 cm is  seen. There is gas shadow making measurements difficult and recommend other modality imaging if clinically indicated Heavily calcified plaque noted in the abdominal aorta. Normal iliac artery velocity bilateral. Recheck in 1 year for stability.  EKG 01/25/2022: Sinus rhythm 58 bpm Incomplete RBBB  CT cardiac scoring 01/12/2022: 1. Coronary calcium score of 318.6 is at the 85th percentile for the patient's age, sex and race. LM:  41.7 LAD: 121 LCx: 2.9 RCA: 153  2. Focal aortic valvular calcification. Correlation with echocardiography may be helpful especially given proximal aortic dilatation. 3. Mild aneurysmal dilatation of the ascending thoracic aorta measuring up to 4.1 cm in maximum caliber. Recommend annual imaging followup by CTA or MRA. This recommendation follows 2010 ACCF/AHA/AATS/ACR/ASA/SCA/SCAI/SIR/STS/SVM Guidelines for the Diagnosis and Management of Patients with Thoracic Aortic Disease. Circulation. 2010; 121: P619-J093. Aortic aneurysm NOS (ICD10-I71.9)    Recent labs: 09/26/2022: Glucose 115, BUN/Cr 13/0.84. EGFR 99. Na/K 139/4.0.  Albumin 5.1. Rest of the CMP normal Chol 176, TG 203, HDL 43, LDL 98 Lipoprotein (a) 16 normal  12/13/2021: Glucose 107, BUN/Cr 22/0.76. EGFR 103. Na/K 139/4.3. AST/ALT 42/53. Rest of the CMP normal H/H 15/44. MCV 96. Platelets 268 HbA1C NA Chol 251, TG 130, HDL 47, LDL 180 TSH 0.6 normal    Review of Systems  Cardiovascular:  Positive for chest pain (Atypical). Negative for dyspnea on exertion, leg swelling, palpitations and syncope.         There were no vitals filed for this visit.    There is no height or weight on file to calculate BMI. There were no vitals filed for this visit.    Objective:   Physical Exam Vitals and nursing note reviewed.  Constitutional:      General: He is not in acute distress. Neck:     Vascular: No JVD.  Cardiovascular:     Rate and Rhythm: Normal rate and regular rhythm.     Heart sounds: Murmur heard.     Harsh midsystolic murmur is present with a grade of 1/6 at the upper right sternal border radiating to the neck.  Pulmonary:     Effort: Pulmonary effort is normal.     Breath sounds: Normal breath sounds. No wheezing or rales.  Musculoskeletal:     Right lower leg: No edema.     Left lower leg: No edema.          Visit diagnoses:   ICD-10-CM   1. Aneurysm of ascending aorta without rupture  (HCC)  I71.21 EKG 12-Lead    CT ANGIO CHEST AORTA W/CM & OR WO/CM    2. Elevated coronary artery calcium score  R93.1     3. Mixed hyperlipidemia  E78.2 Lipid panel    Lipid panel    4. Abdominal aortic aneurysm (AAA) without rupture, unspecified part ([place])  I71.40 CT ANGIO CHEST AORTA W/CM & OR WO/CM       Orders Placed This Encounter  Procedures   CT ANGIO CHEST AORTA W/CM & OR WO/CM   Lipid panel   EKG 12-Lead     Meds ordered this encounter  Medications   atorvastatin (LIPITOR) 80 MG tablet    Sig: Take 1 tablet (80 mg total) by mouth daily.    Dispense:  90 tablet    Refill:  3     Assessment & Recommendations:   61y.o. Caucasian male with elevated coronary calcium score, thoracic aorta aneurysm, family h/o CAD  Elevated coronary calcium score: 85th percentile, including left main.  Strong family history  of coronary artery disease.   No ischemia on stress testing (01/2022). Chol 176, TG 203, HDL 43, LDL 98 on Lipitor 40 mg daily. Increase Lipitor to 80 mg daily for further LDL loweinf=g. Lipopritein (a) normal  Aortic valve calcification, thoracic aortic aneurysm: Trace AS with calcification of trileaflet valve. TAA 4.1 cm (noted on both noncontrast CT and echocardiogram 02/2022) Will obtain CTA chest/abdominal aorta.  AAA: Aorta duplex 02/2022. Moderate dilatation of the abdominal aorta is noted in the proximal and mid aorta. An abdominal aortic aneurysm measuring 3.9 x 4 x 2.1 cm is seen. There is gas shadow making measurements difficult and recommend other modality imaging if clinically indicated Heavily calcified plaque noted in the abdominal aorta. Will obtain CTA chest/abdominal aorta.  Avoid weight lifting >20 pounds. Continue losartan, as part of losartan/hydrochlorothiazide.  Patient is overweight.  Given his coronary artery disease as described above, I do think would benefit from [place] for weight loss, in addition to diet and exercise.  Lipid  panel and f/u in 6 months   Nigel Mormon, MD Pager: [phone] Office: [phone]

## 2022-10-03 DIAGNOSIS — M25511 Pain in right shoulder: Secondary | ICD-10-CM | POA: Diagnosis not present

## 2022-10-03 DIAGNOSIS — M25611 Stiffness of right shoulder, not elsewhere classified: Secondary | ICD-10-CM | POA: Diagnosis not present

## 2022-10-03 DIAGNOSIS — M6281 Muscle weakness (generalized): Secondary | ICD-10-CM | POA: Diagnosis not present

## 2022-10-05 DIAGNOSIS — M25511 Pain in right shoulder: Secondary | ICD-10-CM | POA: Diagnosis not present

## 2022-10-05 DIAGNOSIS — M6281 Muscle weakness (generalized): Secondary | ICD-10-CM | POA: Diagnosis not present

## 2022-10-05 DIAGNOSIS — M25611 Stiffness of right shoulder, not elsewhere classified: Secondary | ICD-10-CM | POA: Diagnosis not present

## 2022-10-10 DIAGNOSIS — M25511 Pain in right shoulder: Secondary | ICD-10-CM | POA: Diagnosis not present

## 2022-10-10 DIAGNOSIS — M6281 Muscle weakness (generalized): Secondary | ICD-10-CM | POA: Diagnosis not present

## 2022-10-10 DIAGNOSIS — M25611 Stiffness of right shoulder, not elsewhere classified: Secondary | ICD-10-CM | POA: Diagnosis not present

## 2022-10-12 DIAGNOSIS — M25511 Pain in right shoulder: Secondary | ICD-10-CM | POA: Diagnosis not present

## 2022-10-12 DIAGNOSIS — M6281 Muscle weakness (generalized): Secondary | ICD-10-CM | POA: Diagnosis not present

## 2022-10-12 DIAGNOSIS — M25611 Stiffness of right shoulder, not elsewhere classified: Secondary | ICD-10-CM | POA: Diagnosis not present

## 2022-10-17 DIAGNOSIS — M25511 Pain in right shoulder: Secondary | ICD-10-CM | POA: Diagnosis not present

## 2022-10-17 DIAGNOSIS — M25611 Stiffness of right shoulder, not elsewhere classified: Secondary | ICD-10-CM | POA: Diagnosis not present

## 2022-10-17 DIAGNOSIS — M6281 Muscle weakness (generalized): Secondary | ICD-10-CM | POA: Diagnosis not present

## 2022-10-19 DIAGNOSIS — M25511 Pain in right shoulder: Secondary | ICD-10-CM | POA: Diagnosis not present

## 2022-10-19 DIAGNOSIS — M6281 Muscle weakness (generalized): Secondary | ICD-10-CM | POA: Diagnosis not present

## 2022-10-19 DIAGNOSIS — M25611 Stiffness of right shoulder, not elsewhere classified: Secondary | ICD-10-CM | POA: Diagnosis not present

## 2022-10-24 DIAGNOSIS — M5416 Radiculopathy, lumbar region: Secondary | ICD-10-CM | POA: Diagnosis not present

## 2022-10-25 DIAGNOSIS — M25611 Stiffness of right shoulder, not elsewhere classified: Secondary | ICD-10-CM | POA: Diagnosis not present

## 2022-10-25 DIAGNOSIS — M25511 Pain in right shoulder: Secondary | ICD-10-CM | POA: Diagnosis not present

## 2022-10-25 DIAGNOSIS — M6281 Muscle weakness (generalized): Secondary | ICD-10-CM | POA: Diagnosis not present

## 2022-10-31 DIAGNOSIS — M5416 Radiculopathy, lumbar region: Secondary | ICD-10-CM | POA: Diagnosis not present

## 2022-11-07 DIAGNOSIS — M25512 Pain in left shoulder: Secondary | ICD-10-CM | POA: Diagnosis not present

## 2022-11-13 DIAGNOSIS — M25512 Pain in left shoulder: Secondary | ICD-10-CM | POA: Diagnosis not present

## 2022-11-16 ENCOUNTER — Other Ambulatory Visit: Payer: Self-pay | Admitting: Cardiology

## 2022-11-21 DIAGNOSIS — M5416 Radiculopathy, lumbar region: Secondary | ICD-10-CM | POA: Diagnosis not present

## 2022-11-21 DIAGNOSIS — M25512 Pain in left shoulder: Secondary | ICD-10-CM | POA: Diagnosis not present

## 2022-11-21 DIAGNOSIS — G8929 Other chronic pain: Secondary | ICD-10-CM | POA: Diagnosis not present

## 2022-11-28 DIAGNOSIS — G8918 Other acute postprocedural pain: Secondary | ICD-10-CM | POA: Diagnosis not present

## 2022-11-28 DIAGNOSIS — S43432A Superior glenoid labrum lesion of left shoulder, initial encounter: Secondary | ICD-10-CM | POA: Diagnosis not present

## 2022-11-28 DIAGNOSIS — M19012 Primary osteoarthritis, left shoulder: Secondary | ICD-10-CM | POA: Diagnosis not present

## 2022-11-28 DIAGNOSIS — M24112 Other articular cartilage disorders, left shoulder: Secondary | ICD-10-CM | POA: Diagnosis not present

## 2022-11-28 DIAGNOSIS — M94212 Chondromalacia, left shoulder: Secondary | ICD-10-CM | POA: Diagnosis not present

## 2022-11-28 DIAGNOSIS — M75112 Incomplete rotator cuff tear or rupture of left shoulder, not specified as traumatic: Secondary | ICD-10-CM | POA: Diagnosis not present

## 2022-11-28 DIAGNOSIS — M25562 Pain in left knee: Secondary | ICD-10-CM | POA: Diagnosis not present

## 2022-11-28 DIAGNOSIS — M7542 Impingement syndrome of left shoulder: Secondary | ICD-10-CM | POA: Diagnosis not present

## 2022-11-28 DIAGNOSIS — M898X1 Other specified disorders of bone, shoulder: Secondary | ICD-10-CM | POA: Diagnosis not present

## 2022-11-28 DIAGNOSIS — M7522 Bicipital tendinitis, left shoulder: Secondary | ICD-10-CM | POA: Diagnosis not present

## 2022-11-28 DIAGNOSIS — M65812 Other synovitis and tenosynovitis, left shoulder: Secondary | ICD-10-CM | POA: Diagnosis not present

## 2022-11-28 HISTORY — PX: ROTATOR CUFF REPAIR: SHX139

## 2022-12-12 DIAGNOSIS — Z4789 Encounter for other orthopedic aftercare: Secondary | ICD-10-CM | POA: Diagnosis not present

## 2022-12-12 DIAGNOSIS — M6281 Muscle weakness (generalized): Secondary | ICD-10-CM | POA: Diagnosis not present

## 2022-12-12 DIAGNOSIS — M25512 Pain in left shoulder: Secondary | ICD-10-CM | POA: Diagnosis not present

## 2022-12-14 DIAGNOSIS — M6281 Muscle weakness (generalized): Secondary | ICD-10-CM | POA: Diagnosis not present

## 2022-12-14 DIAGNOSIS — M25512 Pain in left shoulder: Secondary | ICD-10-CM | POA: Diagnosis not present

## 2022-12-14 DIAGNOSIS — Z4789 Encounter for other orthopedic aftercare: Secondary | ICD-10-CM | POA: Diagnosis not present

## 2022-12-19 DIAGNOSIS — M25512 Pain in left shoulder: Secondary | ICD-10-CM | POA: Diagnosis not present

## 2022-12-19 DIAGNOSIS — Z4789 Encounter for other orthopedic aftercare: Secondary | ICD-10-CM | POA: Diagnosis not present

## 2022-12-19 DIAGNOSIS — M6281 Muscle weakness (generalized): Secondary | ICD-10-CM | POA: Diagnosis not present

## 2022-12-21 DIAGNOSIS — M25512 Pain in left shoulder: Secondary | ICD-10-CM | POA: Diagnosis not present

## 2022-12-21 DIAGNOSIS — Z4789 Encounter for other orthopedic aftercare: Secondary | ICD-10-CM | POA: Diagnosis not present

## 2022-12-21 DIAGNOSIS — M6281 Muscle weakness (generalized): Secondary | ICD-10-CM | POA: Diagnosis not present

## 2022-12-26 DIAGNOSIS — M25512 Pain in left shoulder: Secondary | ICD-10-CM | POA: Diagnosis not present

## 2022-12-26 DIAGNOSIS — Z4789 Encounter for other orthopedic aftercare: Secondary | ICD-10-CM | POA: Diagnosis not present

## 2022-12-26 DIAGNOSIS — M6281 Muscle weakness (generalized): Secondary | ICD-10-CM | POA: Diagnosis not present

## 2022-12-28 DIAGNOSIS — M6281 Muscle weakness (generalized): Secondary | ICD-10-CM | POA: Diagnosis not present

## 2022-12-28 DIAGNOSIS — Z4789 Encounter for other orthopedic aftercare: Secondary | ICD-10-CM | POA: Diagnosis not present

## 2022-12-28 DIAGNOSIS — M25512 Pain in left shoulder: Secondary | ICD-10-CM | POA: Diagnosis not present

## 2023-01-02 DIAGNOSIS — Z4789 Encounter for other orthopedic aftercare: Secondary | ICD-10-CM | POA: Diagnosis not present

## 2023-01-02 DIAGNOSIS — M6281 Muscle weakness (generalized): Secondary | ICD-10-CM | POA: Diagnosis not present

## 2023-01-02 DIAGNOSIS — M25512 Pain in left shoulder: Secondary | ICD-10-CM | POA: Diagnosis not present

## 2023-01-04 DIAGNOSIS — Z4789 Encounter for other orthopedic aftercare: Secondary | ICD-10-CM | POA: Diagnosis not present

## 2023-01-04 DIAGNOSIS — M25512 Pain in left shoulder: Secondary | ICD-10-CM | POA: Diagnosis not present

## 2023-01-04 DIAGNOSIS — M6281 Muscle weakness (generalized): Secondary | ICD-10-CM | POA: Diagnosis not present

## 2023-01-08 DIAGNOSIS — M25511 Pain in right shoulder: Secondary | ICD-10-CM | POA: Diagnosis not present

## 2023-01-08 DIAGNOSIS — M25512 Pain in left shoulder: Secondary | ICD-10-CM | POA: Diagnosis not present

## 2023-01-09 DIAGNOSIS — M25512 Pain in left shoulder: Secondary | ICD-10-CM | POA: Diagnosis not present

## 2023-01-09 DIAGNOSIS — M6281 Muscle weakness (generalized): Secondary | ICD-10-CM | POA: Diagnosis not present

## 2023-01-09 DIAGNOSIS — Z4789 Encounter for other orthopedic aftercare: Secondary | ICD-10-CM | POA: Diagnosis not present

## 2023-01-11 DIAGNOSIS — Z4789 Encounter for other orthopedic aftercare: Secondary | ICD-10-CM | POA: Diagnosis not present

## 2023-01-11 DIAGNOSIS — M25512 Pain in left shoulder: Secondary | ICD-10-CM | POA: Diagnosis not present

## 2023-01-11 DIAGNOSIS — M6281 Muscle weakness (generalized): Secondary | ICD-10-CM | POA: Diagnosis not present

## 2023-01-16 DIAGNOSIS — Z4789 Encounter for other orthopedic aftercare: Secondary | ICD-10-CM | POA: Diagnosis not present

## 2023-01-16 DIAGNOSIS — M25512 Pain in left shoulder: Secondary | ICD-10-CM | POA: Diagnosis not present

## 2023-01-16 DIAGNOSIS — M6281 Muscle weakness (generalized): Secondary | ICD-10-CM | POA: Diagnosis not present

## 2023-01-18 DIAGNOSIS — M25512 Pain in left shoulder: Secondary | ICD-10-CM | POA: Diagnosis not present

## 2023-01-18 DIAGNOSIS — Z4789 Encounter for other orthopedic aftercare: Secondary | ICD-10-CM | POA: Diagnosis not present

## 2023-01-18 DIAGNOSIS — M6281 Muscle weakness (generalized): Secondary | ICD-10-CM | POA: Diagnosis not present

## 2023-01-23 DIAGNOSIS — M25512 Pain in left shoulder: Secondary | ICD-10-CM | POA: Diagnosis not present

## 2023-01-23 DIAGNOSIS — Z4789 Encounter for other orthopedic aftercare: Secondary | ICD-10-CM | POA: Diagnosis not present

## 2023-01-23 DIAGNOSIS — M6281 Muscle weakness (generalized): Secondary | ICD-10-CM | POA: Diagnosis not present

## 2023-01-25 DIAGNOSIS — Z4789 Encounter for other orthopedic aftercare: Secondary | ICD-10-CM | POA: Diagnosis not present

## 2023-01-25 DIAGNOSIS — M25512 Pain in left shoulder: Secondary | ICD-10-CM | POA: Diagnosis not present

## 2023-01-25 DIAGNOSIS — M6281 Muscle weakness (generalized): Secondary | ICD-10-CM | POA: Diagnosis not present

## 2023-01-30 DIAGNOSIS — M6281 Muscle weakness (generalized): Secondary | ICD-10-CM | POA: Diagnosis not present

## 2023-01-30 DIAGNOSIS — M25512 Pain in left shoulder: Secondary | ICD-10-CM | POA: Diagnosis not present

## 2023-01-30 DIAGNOSIS — Z4789 Encounter for other orthopedic aftercare: Secondary | ICD-10-CM | POA: Diagnosis not present

## 2023-02-01 DIAGNOSIS — M25512 Pain in left shoulder: Secondary | ICD-10-CM | POA: Diagnosis not present

## 2023-02-01 DIAGNOSIS — Z4789 Encounter for other orthopedic aftercare: Secondary | ICD-10-CM | POA: Diagnosis not present

## 2023-02-01 DIAGNOSIS — M6281 Muscle weakness (generalized): Secondary | ICD-10-CM | POA: Diagnosis not present

## 2023-02-06 DIAGNOSIS — M6281 Muscle weakness (generalized): Secondary | ICD-10-CM | POA: Diagnosis not present

## 2023-02-06 DIAGNOSIS — M25512 Pain in left shoulder: Secondary | ICD-10-CM | POA: Diagnosis not present

## 2023-02-06 DIAGNOSIS — Z4789 Encounter for other orthopedic aftercare: Secondary | ICD-10-CM | POA: Diagnosis not present

## 2023-02-08 DIAGNOSIS — M6281 Muscle weakness (generalized): Secondary | ICD-10-CM | POA: Diagnosis not present

## 2023-02-08 DIAGNOSIS — Z4789 Encounter for other orthopedic aftercare: Secondary | ICD-10-CM | POA: Diagnosis not present

## 2023-02-08 DIAGNOSIS — M25512 Pain in left shoulder: Secondary | ICD-10-CM | POA: Diagnosis not present

## 2023-02-11 ENCOUNTER — Other Ambulatory Visit: Payer: Self-pay | Admitting: Cardiology

## 2023-02-11 DIAGNOSIS — I739 Peripheral vascular disease, unspecified: Secondary | ICD-10-CM

## 2023-02-13 DIAGNOSIS — Z4789 Encounter for other orthopedic aftercare: Secondary | ICD-10-CM | POA: Diagnosis not present

## 2023-02-13 DIAGNOSIS — M25512 Pain in left shoulder: Secondary | ICD-10-CM | POA: Diagnosis not present

## 2023-02-13 DIAGNOSIS — M6281 Muscle weakness (generalized): Secondary | ICD-10-CM | POA: Diagnosis not present

## 2023-02-15 DIAGNOSIS — Z4789 Encounter for other orthopedic aftercare: Secondary | ICD-10-CM | POA: Diagnosis not present

## 2023-02-15 DIAGNOSIS — M25512 Pain in left shoulder: Secondary | ICD-10-CM | POA: Diagnosis not present

## 2023-02-15 DIAGNOSIS — M6281 Muscle weakness (generalized): Secondary | ICD-10-CM | POA: Diagnosis not present

## 2023-02-19 DIAGNOSIS — Z4889 Encounter for other specified surgical aftercare: Secondary | ICD-10-CM | POA: Diagnosis not present

## 2023-02-20 DIAGNOSIS — M25512 Pain in left shoulder: Secondary | ICD-10-CM | POA: Diagnosis not present

## 2023-02-20 DIAGNOSIS — Z4789 Encounter for other orthopedic aftercare: Secondary | ICD-10-CM | POA: Diagnosis not present

## 2023-02-20 DIAGNOSIS — M6281 Muscle weakness (generalized): Secondary | ICD-10-CM | POA: Diagnosis not present

## 2023-02-22 DIAGNOSIS — M6281 Muscle weakness (generalized): Secondary | ICD-10-CM | POA: Diagnosis not present

## 2023-02-22 DIAGNOSIS — M25512 Pain in left shoulder: Secondary | ICD-10-CM | POA: Diagnosis not present

## 2023-02-22 DIAGNOSIS — Z4789 Encounter for other orthopedic aftercare: Secondary | ICD-10-CM | POA: Diagnosis not present

## 2023-03-06 DIAGNOSIS — M6281 Muscle weakness (generalized): Secondary | ICD-10-CM | POA: Diagnosis not present

## 2023-03-06 DIAGNOSIS — M25512 Pain in left shoulder: Secondary | ICD-10-CM | POA: Diagnosis not present

## 2023-03-06 DIAGNOSIS — Z4789 Encounter for other orthopedic aftercare: Secondary | ICD-10-CM | POA: Diagnosis not present

## 2023-03-08 DIAGNOSIS — M6281 Muscle weakness (generalized): Secondary | ICD-10-CM | POA: Diagnosis not present

## 2023-03-08 DIAGNOSIS — M25512 Pain in left shoulder: Secondary | ICD-10-CM | POA: Diagnosis not present

## 2023-03-08 DIAGNOSIS — Z4789 Encounter for other orthopedic aftercare: Secondary | ICD-10-CM | POA: Diagnosis not present

## 2023-03-26 DIAGNOSIS — E782 Mixed hyperlipidemia: Secondary | ICD-10-CM | POA: Diagnosis not present

## 2023-03-27 LAB — LIPID PANEL
Chol/HDL Ratio: 3.3 ratio (ref 0.0–5.0)
Cholesterol, Total: 139 mg/dL (ref 100–199)
HDL: 42 mg/dL (ref >39)
LDL Chol Calc (NIH): 70 mg/dL (ref 0–99)
Triglycerides: 157 mg/dL — ABNORMAL HIGH (ref 0–149)
VLDL Cholesterol Cal: 27 mg/dL (ref 5–40)

## 2023-03-30 ENCOUNTER — Encounter: Payer: Self-pay | Admitting: Cardiology

## 2023-03-30 ENCOUNTER — Ambulatory Visit: Payer: BC Managed Care – PPO | Admitting: Cardiology

## 2023-03-30 VITALS — BP 125/88 | HR 74 | Ht 71.0 in | Wt 224.0 lb

## 2023-03-30 DIAGNOSIS — I7121 Aneurysm of the ascending aorta, without rupture: Secondary | ICD-10-CM

## 2023-03-30 DIAGNOSIS — R931 Abnormal findings on diagnostic imaging of heart and coronary circulation: Secondary | ICD-10-CM

## 2023-03-30 DIAGNOSIS — E782 Mixed hyperlipidemia: Secondary | ICD-10-CM | POA: Diagnosis not present

## 2023-03-30 NOTE — Progress Notes (Signed)
Patient referred by Robert Brunette, MD for coronary calcification  Subjective:   Robert Wyatt, male    DOB: [birth year], 62 y.o.   MRN: [id number]   Chief Complaint  Patient presents with   AAA   Follow-up   Results     HPI  62 y.o. Caucasian male with elevated coronary calcium score, thoracic aorta aneurysm, family h/o CAD  In the past 6 months, patient has undergone 2 shoulder surgeries.  Recovery was difficult, but is started working back now 3 weeks ago.  He denies any cardiac complaints at this time.  Reviewed recent test results with the patient, details below.    Initial consultation visit 02/2022: Patient is here with his wife today. He works with [employer], building cars. He stays active with his work, but does not do any regular physical activity.  Recent CT cardiac scoring scan showed aortic valve calcification, dilation of proximal ascending aorta, and coronary calcium score in 85th percentile. He has strong family h/o CAD with father having had MI at 34, brother at 32. His father also has h/o aorta aneurysm, details now known.     Current Outpatient Medications:    ASPIRIN LOW DOSE 81 MG tablet, TAKE 1 TABLET(81 MG) BY MOUTH DAILY. SWALLOW WHOLE, Disp: 90 tablet, Rfl: 3   atorvastatin (LIPITOR) 80 MG tablet, Take 1 tablet (80 mg total) by mouth daily., Disp: 90 tablet, Rfl: 3   ibuprofen (ADVIL,MOTRIN) 100 MG chewable tablet, Chew by mouth every 8 (eight) hours as needed. (Patient not taking: Reported on 09/29/2022), Disp: , Rfl:    losartan-hydrochlorothiazide (HYZAAR) 50-12.5 MG tablet, , Disp: , Rfl: 0   meloxicam (MOBIC) 15 MG tablet, Take 1 tablet (15 mg total) by mouth daily., Disp: 30 tablet, Rfl: 1   omeprazole (PRILOSEC) 20 MG capsule, , Disp: , Rfl: 0   sertraline (ZOLOFT) 100 MG tablet, Take 100 mg by mouth daily., Disp: , Rfl:    Cardiovascular and other pertinent studies:  Reviewed external labs and tests, independently interpreted  EKG  09/29/2022: Sinus rhythm 62 bpm  Normal EKG  Echocardiogram 02/21/2022:  Normal LV systolic function with visual EF 60-65%. Left ventricle cavity  is normal in size. Normal left ventricular wall thickness. Normal global  wall motion. Normal diastolic filling pattern, normal LAP.  Left atrial cavity is normal in size. A lipomatous septum is present.  Native trileaflet aortic valve. Trace aortic regurgitation. Mild aortic  valve leaflet calcification.  Mild pulmonic regurgitation.  The aortic root is normal. Mildly dilated ascending aorta, 41mm.  No prior study for comparison.   Exercise Sestamibi stress test 02/13/2022: Exercise nuclear stress test was performed using Bruce protocol. Patient reached 10.1 METS, and 89% of age predicted maximum heart rate. Exercise capacity was excellent. No chest pain reported. Heart rate and hemodynamic response were normal. Stress EKG revealed no ischemic changes. Normal myocardial perfusion. Stress LVEF 57%. Low risk study.   Abdominal Aortic Duplex 02/21/2022: Moderate dilatation of the abdominal aorta is noted in the proximal and mid aorta. An abdominal aortic aneurysm measuring 3.9 x 4 x 2.1 cm is seen. There is gas shadow making measurements difficult and recommend other modality imaging if clinically indicated Heavily calcified plaque noted in the abdominal aorta. Normal iliac artery velocity bilateral. Recheck in 1 year for stability.  EKG 01/25/2022: Sinus rhythm 58 bpm Incomplete RBBB  CT cardiac scoring 01/12/2022: 1. Coronary calcium score of 318.6 is at the 85th percentile for the patient's age, sex  and race. LM: 41.7 LAD: 121 LCx: 2.9 RCA: 153  2. Focal aortic valvular calcification. Correlation with echocardiography may be helpful especially given proximal aortic dilatation. 3. Mild aneurysmal dilatation of the ascending thoracic aorta measuring up to 4.1 cm in maximum caliber. Recommend annual imaging followup by CTA or MRA. This  recommendation follows 2010 ACCF/AHA/AATS/ACR/ASA/SCA/SCAI/SIR/STS/SVM Guidelines for the Diagnosis and Management of Patients with Thoracic Aortic Disease. Circulation. 2010; 121: W098-J191. Aortic aneurysm NOS (ICD10-I71.9)    Recent labs: 03/26/2023: Chol 139, TG 157, HDL 42, LDL 70  09/26/2022: Glucose 115, BUN/Cr 13/0.84. EGFR 99. Na/K 139/4.0.  Albumin 5.1. Rest of the CMP normal Chol 176, TG 203, HDL 43, LDL 98 Lipoprotein (a) 16 normal  12/13/2021: Glucose 107, BUN/Cr 22/0.76. EGFR 103. Na/K 139/4.3. AST/ALT 42/53. Rest of the CMP normal H/H 15/44. MCV 96. Platelets 268 HbA1C NA Chol 251, TG 130, HDL 47, LDL 180 TSH 0.6 normal    Review of Systems  Cardiovascular:  Negative for chest pain, dyspnea on exertion, leg swelling, palpitations and syncope.         Vitals:   03/30/23 1300  BP: 125/88  Pulse: 74  SpO2: 95%     Body mass index is 31.24 kg/m. Filed Weights   03/30/23 1300  Weight: 224 lb (101.6 kg)     Objective:   Physical Exam Vitals and nursing note reviewed.  Constitutional:      General: He is not in acute distress. Neck:     Vascular: No JVD.  Cardiovascular:     Rate and Rhythm: Normal rate and regular rhythm.     Heart sounds: Murmur heard.     Harsh midsystolic murmur is present with a grade of 1/6 at the upper right sternal border radiating to the neck.  Pulmonary:     Effort: Pulmonary effort is normal.     Breath sounds: Normal breath sounds. No wheezing or rales.  Musculoskeletal:     Right lower leg: No edema.     Left lower leg: No edema.          Visit diagnoses:   ICD-10-CM   1. Elevated coronary artery calcium score  R93.1     2. Mixed hyperlipidemia  E78.2 Lipid panel    Lipid panel    3. Aneurysm of ascending aorta without rupture (HCC)  I71.21 Basic metabolic panel       Orders Placed This Encounter  Procedures   Basic metabolic panel   Lipid panel     No orders of the defined types were placed in  this encounter.    Assessment & Recommendations:   62 y/o Caucasian male with elevated coronary calcium score, thoracic aorta aneurysm, family h/o CAD  Elevated coronary calcium score: 85th percentile, including left main.  Strong family history of coronary artery disease.   No ischemia on stress testing (01/2022). Chol 139, TG 157, HDL 42, LDL 70 (02/2023) on Lipitor 80 mg daily. Continue the same. Lipoprotein (a) normal  Aortic valve calcification, thoracic aortic aneurysm: Trace AS with calcification of trileaflet valve. TAA 4.1 cm (noted on both noncontrast CT and echocardiogram 02/2022) CTA chest/abdominal aorta pending.  AAA: Aorta duplex 02/2022. Moderate dilatation of the abdominal aorta is noted in the proximal and mid aorta. An abdominal aortic aneurysm measuring 3.9 x 4 x 2.1 cm is seen. There is gas shadow making measurements difficult and recommend other modality imaging if clinically indicated Heavily calcified plaque noted in the abdominal aorta. CTA chest/abdominal aorta still pending  Avoid weight lifting >20 pounds. Continue losartan, as part of losartan/hydrochlorothiazide.  Lipid panel and f/u in 6 months    Elder Negus, MD Pager: [phone] Office: [phone]

## 2023-04-04 DIAGNOSIS — Z129 Encounter for screening for malignant neoplasm, site unspecified: Secondary | ICD-10-CM | POA: Diagnosis not present

## 2023-04-04 DIAGNOSIS — L821 Other seborrheic keratosis: Secondary | ICD-10-CM | POA: Diagnosis not present

## 2023-04-04 DIAGNOSIS — L820 Inflamed seborrheic keratosis: Secondary | ICD-10-CM | POA: Diagnosis not present

## 2023-04-04 DIAGNOSIS — L570 Actinic keratosis: Secondary | ICD-10-CM | POA: Diagnosis not present

## 2023-04-04 DIAGNOSIS — D1801 Hemangioma of skin and subcutaneous tissue: Secondary | ICD-10-CM | POA: Diagnosis not present

## 2023-04-06 DIAGNOSIS — M170 Bilateral primary osteoarthritis of knee: Secondary | ICD-10-CM | POA: Diagnosis not present

## 2023-04-12 LAB — LIPID PANEL
Chol/HDL Ratio: 2.6 ratio (ref 0.0–5.0)
Cholesterol, Total: 127 mg/dL (ref 100–199)
HDL: 49 mg/dL (ref >39)
LDL Chol Calc (NIH): 62 mg/dL (ref 0–99)
Triglycerides: 83 mg/dL (ref 0–149)
VLDL Cholesterol Cal: 16 mg/dL (ref 5–40)

## 2023-04-12 NOTE — Progress Notes (Signed)
Pt aware.

## 2023-04-12 NOTE — Progress Notes (Signed)
Cholesterol is very well controlled. Keep up the good work.  Thanks MJP

## 2023-06-17 IMAGING — CT CT CARDIAC CORONARY ARTERY CALCIUM SCORE
3 series · 14 of 20 positions shown, 16 images · non-contrast
Comparison: None.

CLINICAL DATA: 60-year-old Caucasian male with history of
hyperlipidemia and family history of heart disease.

EXAM:
CT CARDIAC CORONARY ARTERY CALCIUM SCORE
TECHNIQUE: Non-contrast imaging through the heart was performed using
prospective ECG gating. Image post processing was performed on an
independent workstation, allowing for quantitative analysis of the
heart and coronary arteries. Note that this exam targets the heart
and the chest was not imaged in its entirety.

[Series 2: calcium scoring 2.00 qr36 bestdiast 71% hrt calciu · axial · 0.42mm/px · z∈[+1530,+1622]mm · 4 of 78 slices shown]
[im 16/78  vessel]
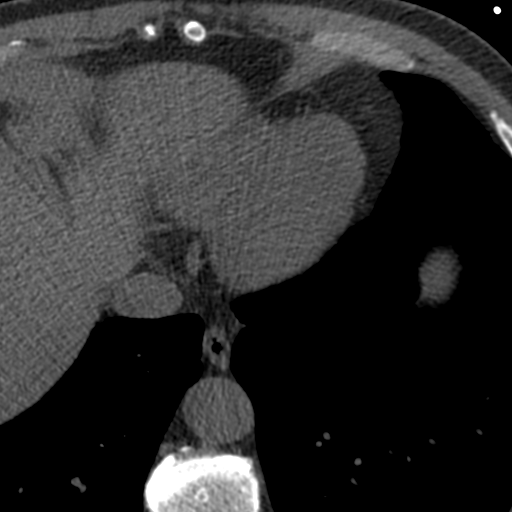
[im 31/78  vessel]
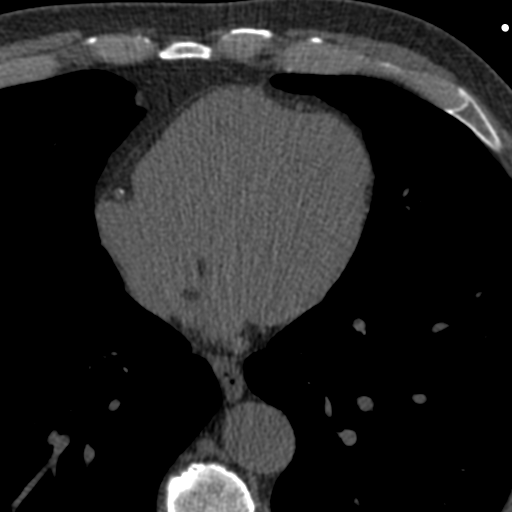
[im 47/78  vessel]
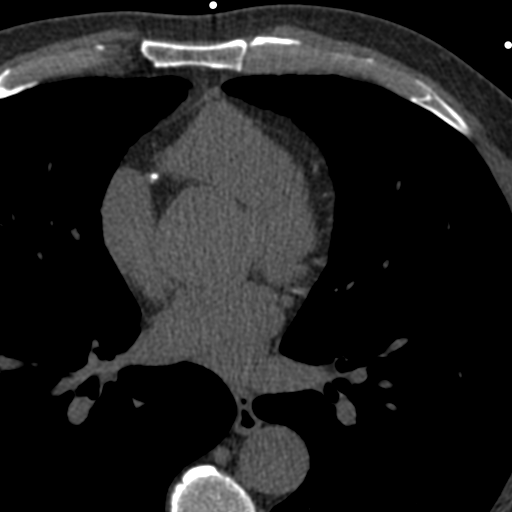
[im 62/78  vessel]
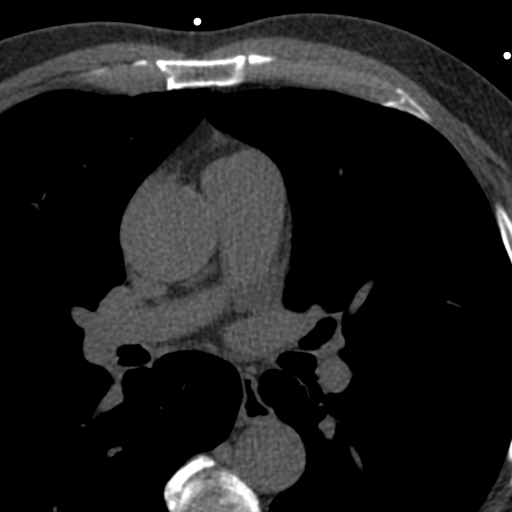

[Series 3: calcium scoring 2.00 br40 bestdiast 71% axial · axial · 0.62mm/px · z∈[+1524,+1628]mm · 5 of 78 slices shown, 7 images]
[im 13/78  vessel]
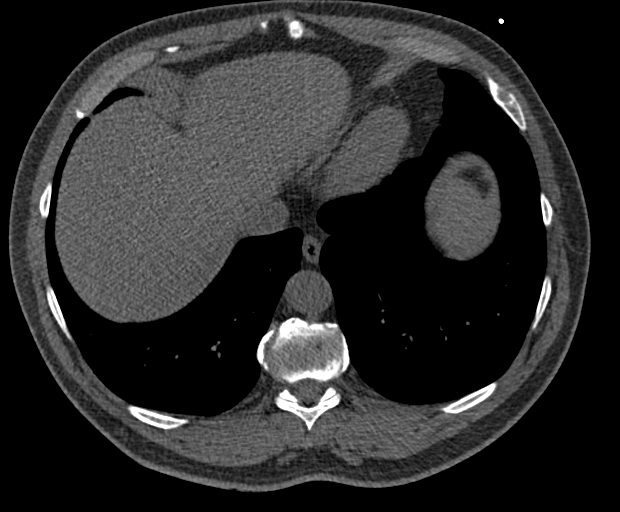
[im 13/78  lung]
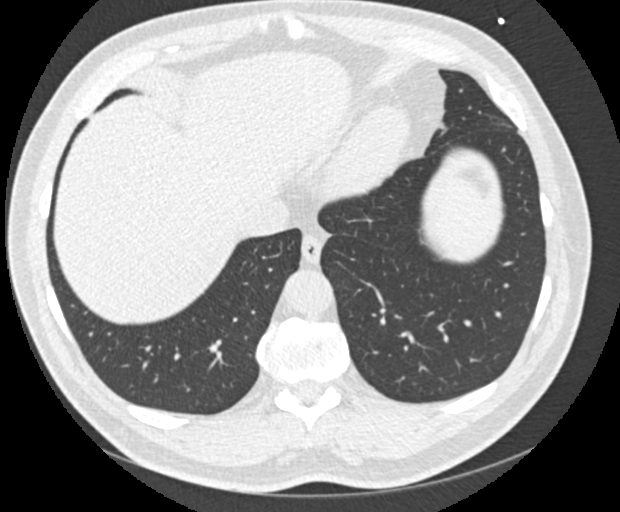
[im 26/78  vessel]
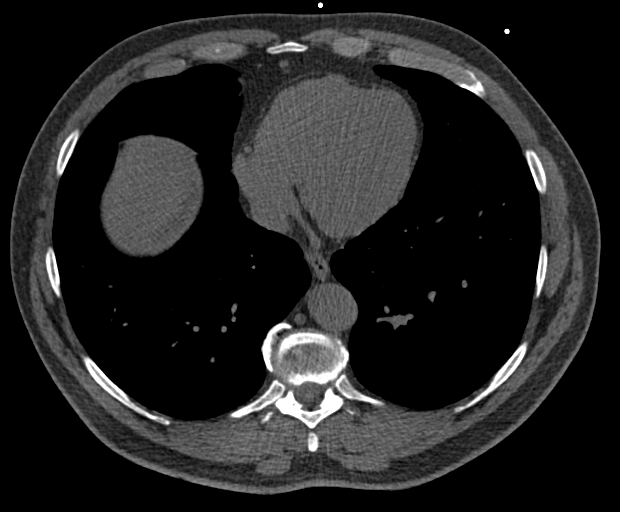
[im 39/78  vessel]
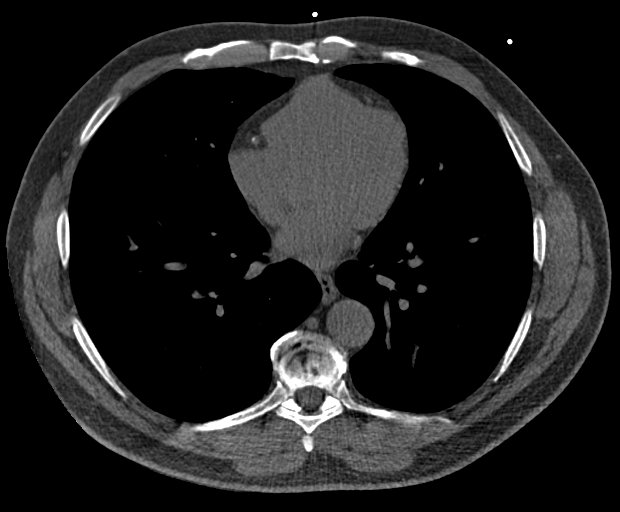
[im 52/78  vessel]
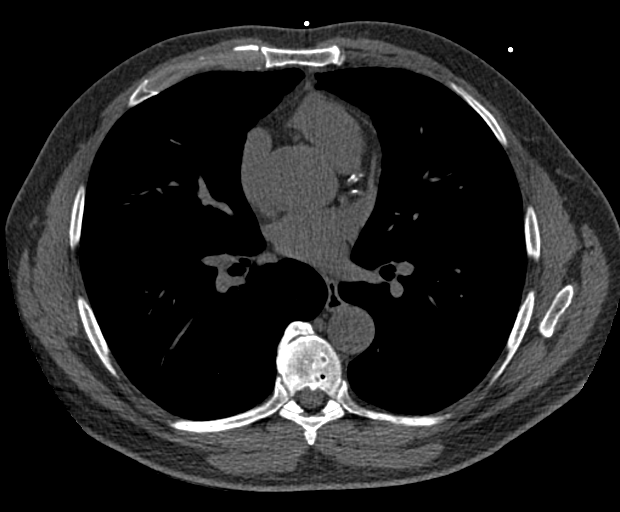
[im 65/78  vessel]
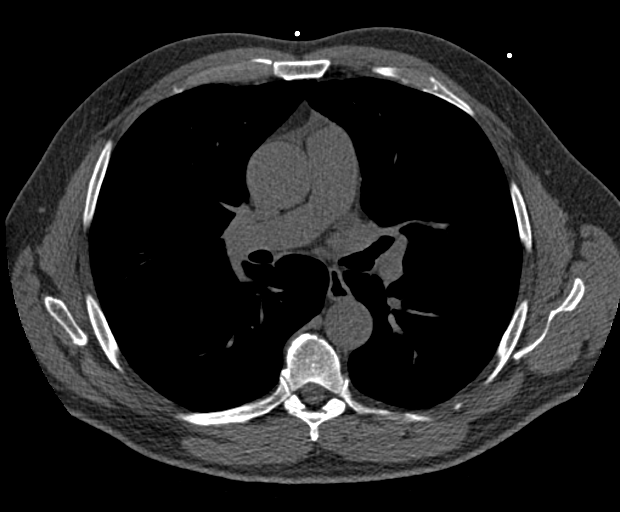
[im 65/78  lung]
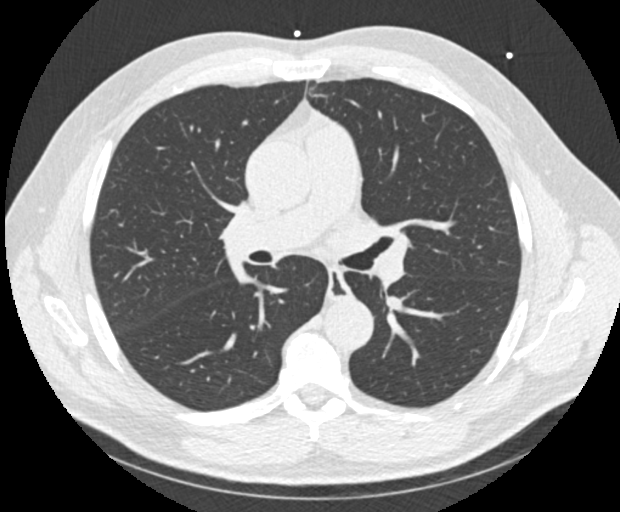

[Series 9: calcium scoring 2.00 br60 bestdiast 71% lungs · axial · 0.62mm/px · z∈[+1524,+1628]mm · 5 of 78 slices shown]
[im 13/78  vessel]
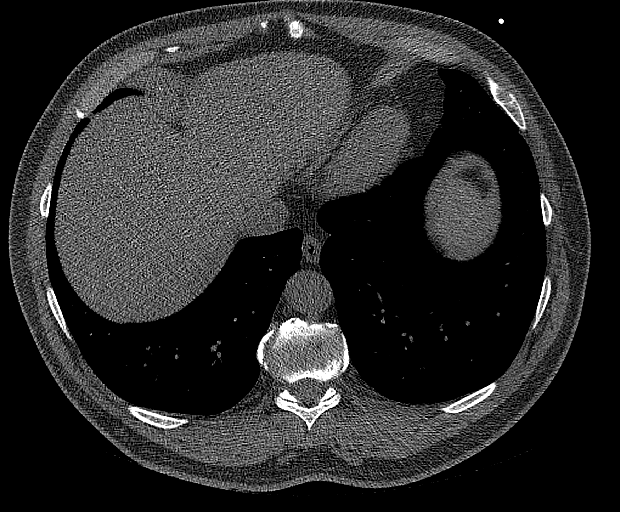
[im 26/78  vessel]
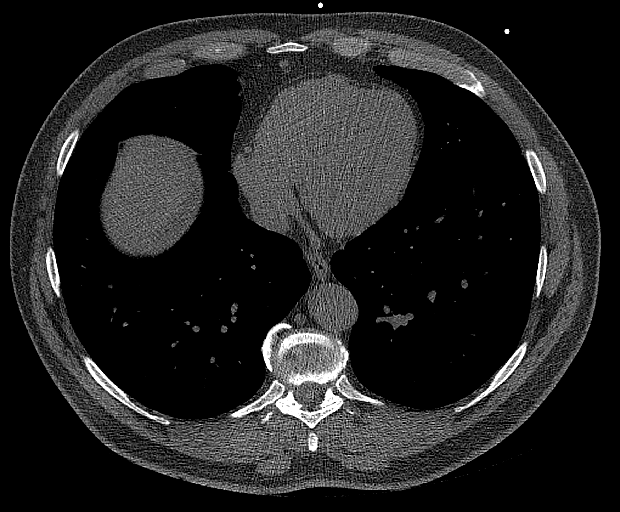
[im 39/78  vessel]
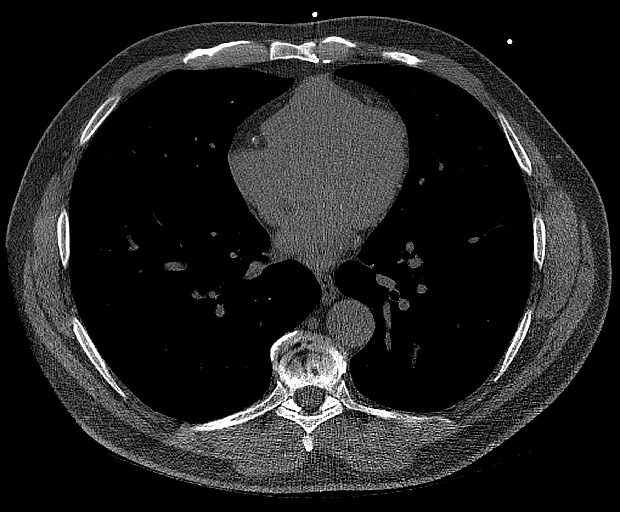
[im 52/78  vessel]
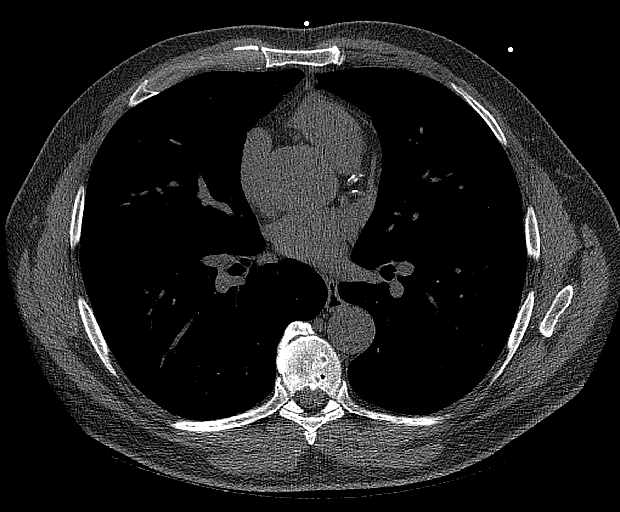
[im 65/78  vessel]
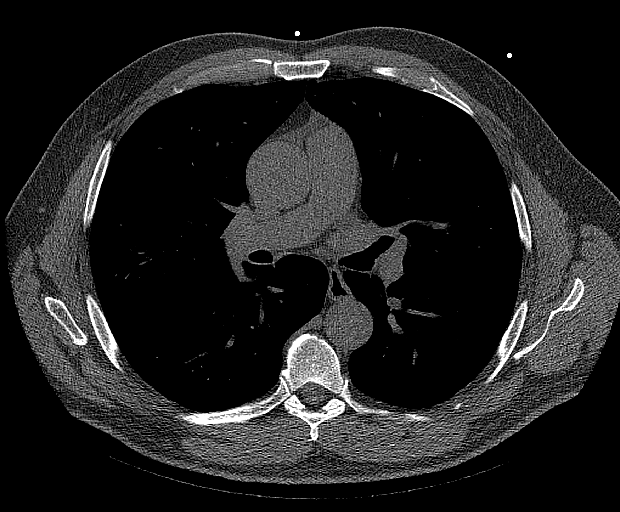

[14 of 20 positions shown; findings below may reference images not displayed]

FINDINGS: CORONARY CALCIUM SCORES:

Left Main:

LAD: 121

LCx:

RCA: 153

Total Agatston Score:

[HOSPITAL] percentile: 85

AORTA MEASUREMENTS:

Ascending Aorta: 40-41 mm

Descending Aorta: 28 mm

OTHER FINDINGS:

The heart size is within normal limits. Focal calcification along
the anterior margin of the aortic valve. No pericardial fluid is
identified. The ascending thoracic aorta is mildly dilated measuring
up to approximately 4-4.1 cm in maximum caliber. Visualized central
pulmonary arteries are normal in caliber. Visualized mediastinum and
hilar regions demonstrate no lymphadenopathy or masses. Visualized
lungs show no evidence of pulmonary edema, consolidation,
pneumothorax, nodule or pleural fluid. Visualized upper abdomen and
bony structures are unremarkable.
IMPRESSION: 1. Coronary calcium score of 318.6 is at the 85th percentile for the
patient's age, sex and race.
2. Focal aortic valvular calcification. Correlation with
echocardiography may be helpful especially given proximal aortic
dilatation.
3. Mild aneurysmal dilatation of the ascending thoracic aorta
measuring up to 4.1 cm in maximum caliber. Recommend annual imaging
followup by CTA or MRA. This recommendation follows 3333
ACCF/AHA/AATS/ACR/ASA/SCA/CALLAN/TRENDY/STALLEN/YOSVANY Guidelines for the
Diagnosis and Management of Patients with Thoracic Aortic Disease.
Circulation. 3333; 121: [id number]. Aortic aneurysm NOS ([id number]-[id number].X)

## 2023-09-23 ENCOUNTER — Other Ambulatory Visit: Payer: Self-pay | Admitting: Cardiology

## 2023-09-28 ENCOUNTER — Ambulatory Visit: Payer: BC Managed Care – PPO | Admitting: Cardiology

## 2024-04-02 ENCOUNTER — Other Ambulatory Visit: Payer: Self-pay | Admitting: Cardiology

## 2024-04-29 ENCOUNTER — Other Ambulatory Visit: Payer: Self-pay | Admitting: Cardiology

## 2024-05-13 ENCOUNTER — Other Ambulatory Visit: Payer: Self-pay | Admitting: Cardiology

## 2024-06-02 ENCOUNTER — Other Ambulatory Visit: Payer: Self-pay | Admitting: Cardiology
